# Patient Record
Sex: Female | Born: 1985
Health system: Southern US, Community
[De-identification: ages and names within clinical notes are randomized; demographics above are authoritative.]

## PROBLEM LIST (undated history)

## (undated) ENCOUNTER — Emergency Department (HOSPITAL_COMMUNITY): Discharge status: Absent without leave | Payer: Self-pay

## (undated) DIAGNOSIS — J40 Bronchitis, not specified as acute or chronic: Secondary | ICD-10-CM

## (undated) DIAGNOSIS — Z789 Other specified health status: Secondary | ICD-10-CM

## (undated) DIAGNOSIS — J189 Pneumonia, unspecified organism: Secondary | ICD-10-CM

## (undated) HISTORY — PX: WISDOM TOOTH EXTRACTION: SHX21

## (undated) HISTORY — DX: Other specified health status: Z78.9

---
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
## 2007-01-18 ENCOUNTER — Emergency Department (HOSPITAL_COMMUNITY): Admission: EM | Admit: 2007-01-18 | Discharge: 2007-01-18 | Payer: Self-pay | Admitting: Emergency Medicine

## 2007-02-02 ENCOUNTER — Emergency Department (HOSPITAL_COMMUNITY): Admission: EM | Admit: 2007-02-02 | Discharge: 2007-02-02 | Payer: Self-pay | Admitting: Emergency Medicine

## 2007-07-18 ENCOUNTER — Emergency Department (HOSPITAL_COMMUNITY): Admission: EM | Admit: 2007-07-18 | Discharge: 2007-07-18 | Payer: Self-pay | Admitting: Emergency Medicine

## 2007-08-28 ENCOUNTER — Emergency Department (HOSPITAL_COMMUNITY): Admission: EM | Admit: 2007-08-28 | Discharge: 2007-08-28 | Payer: Self-pay | Admitting: Emergency Medicine

## 2008-02-21 IMAGING — CR DG KNEE COMPLETE 4+V*L*
4 series · 4 of 4 positions shown · non-contrast
Comparison: none

CLINICAL DATA: Knee pain

LEFT KNEE - 4  VIEW:

[view not recorded (1 of 4)]
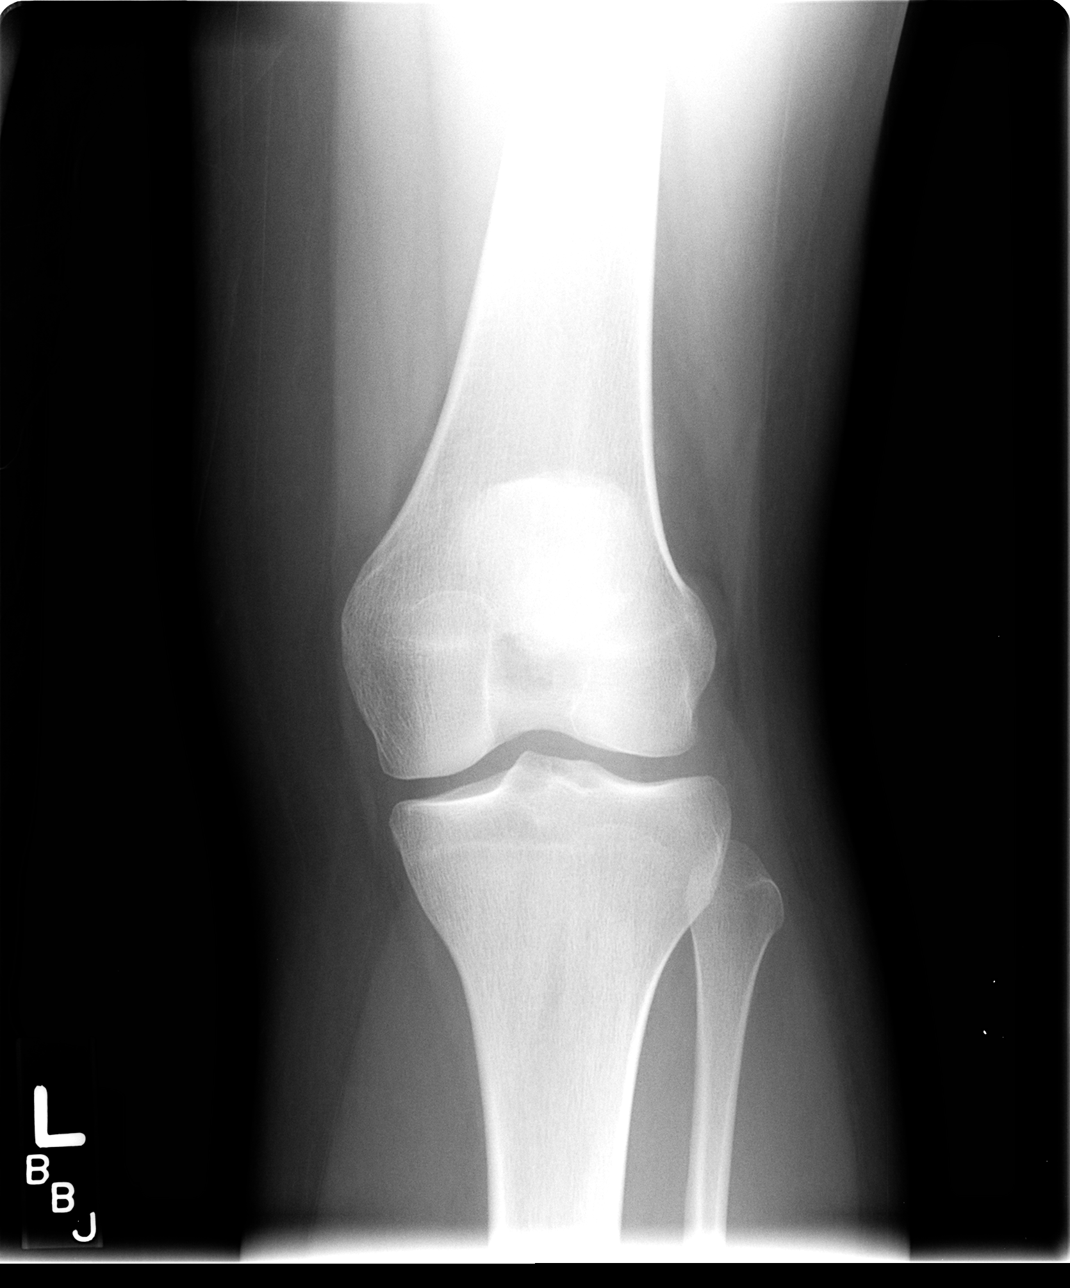

[view not recorded (2 of 4)]
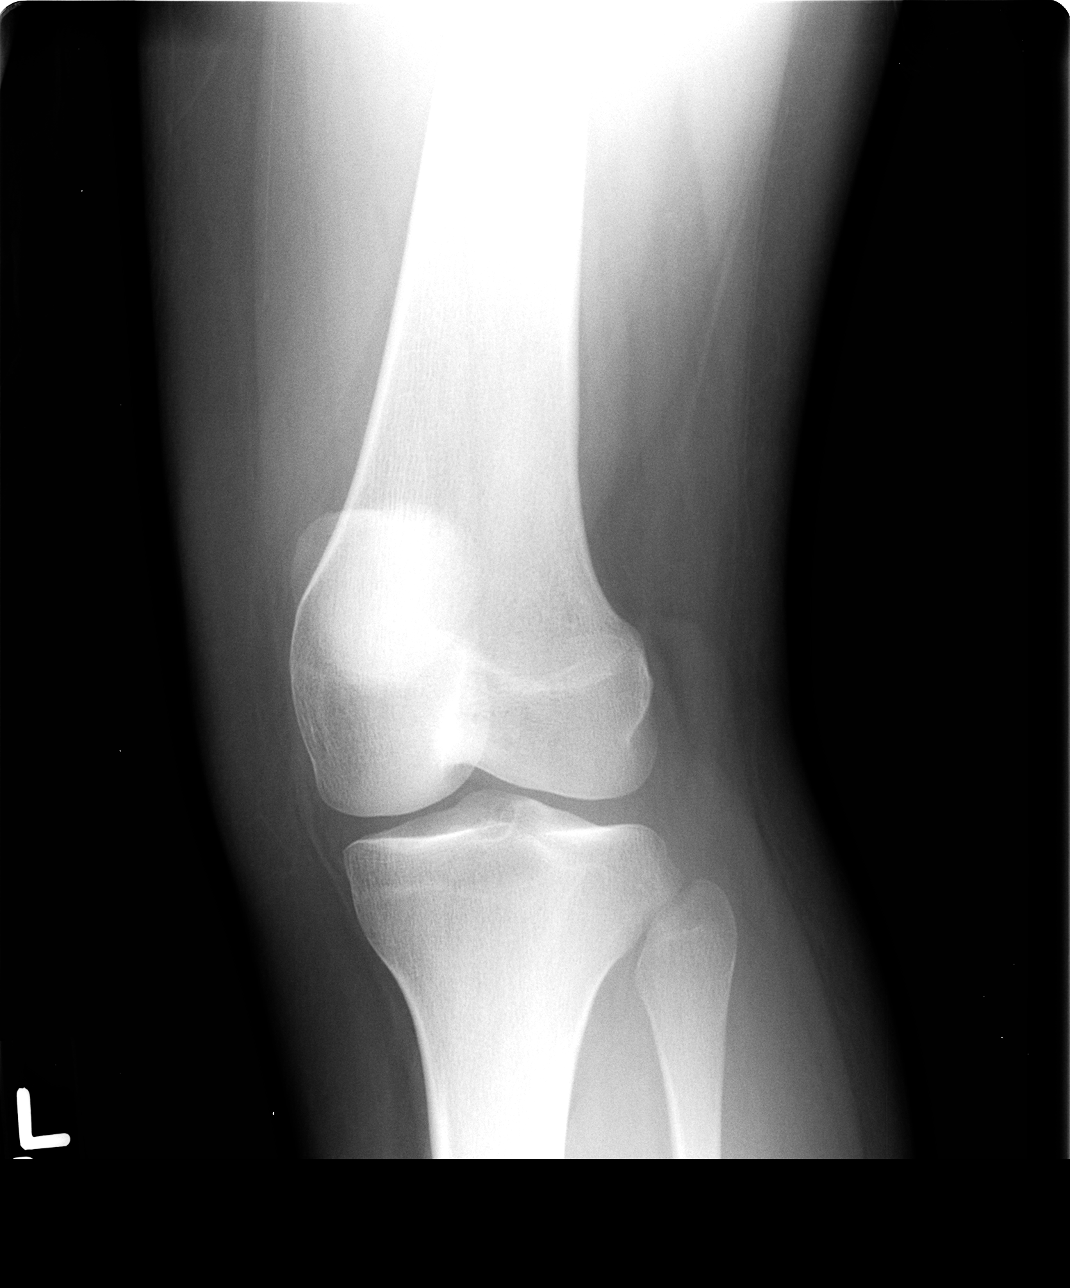

[view not recorded (3 of 4)]
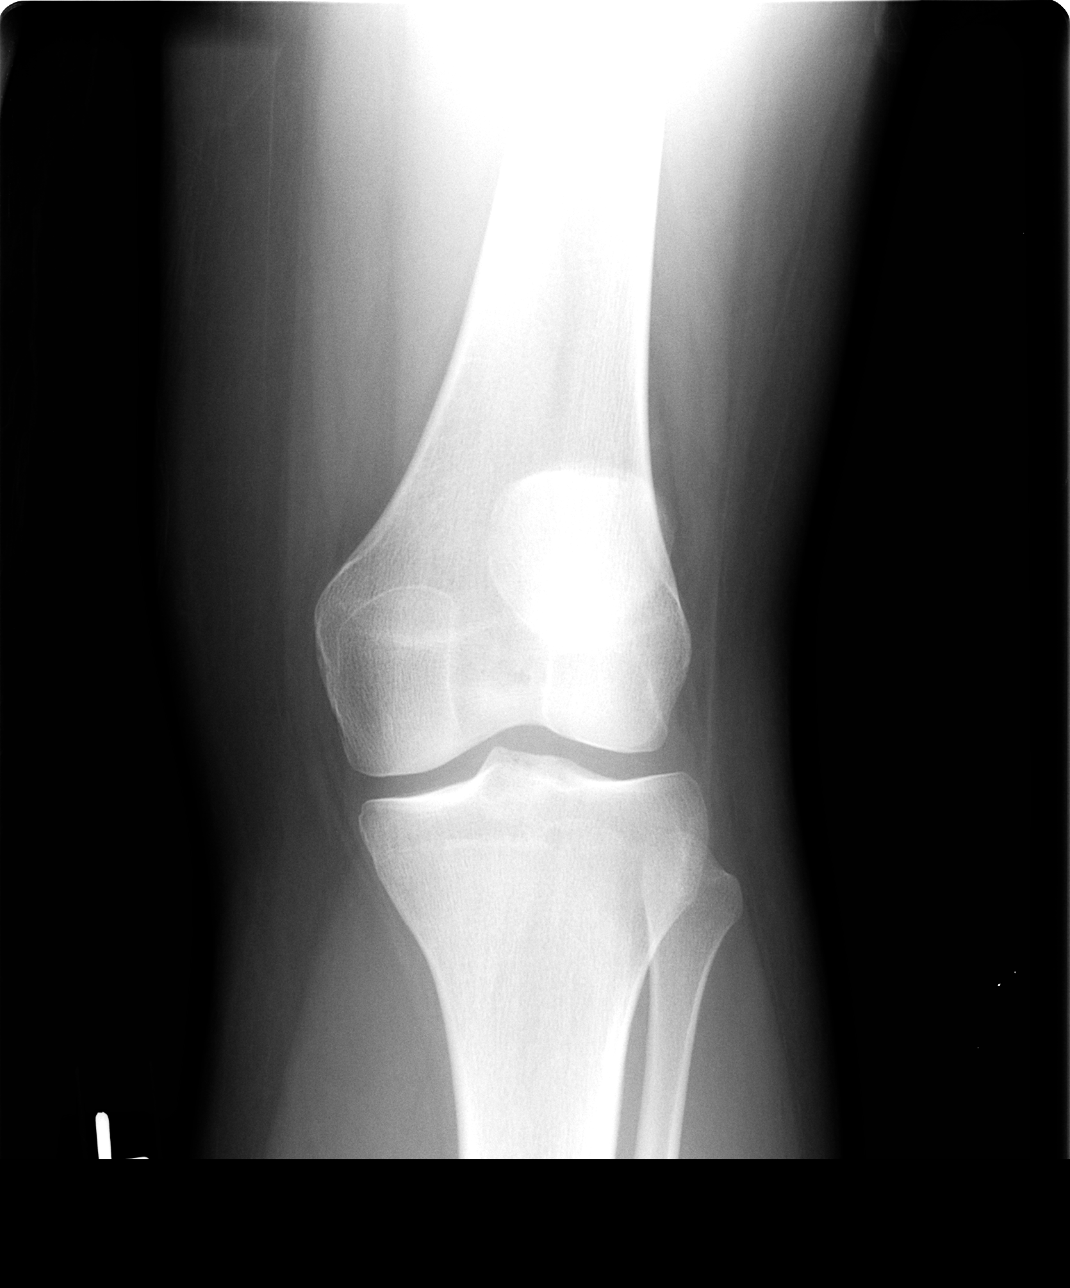

[view not recorded (4 of 4)]
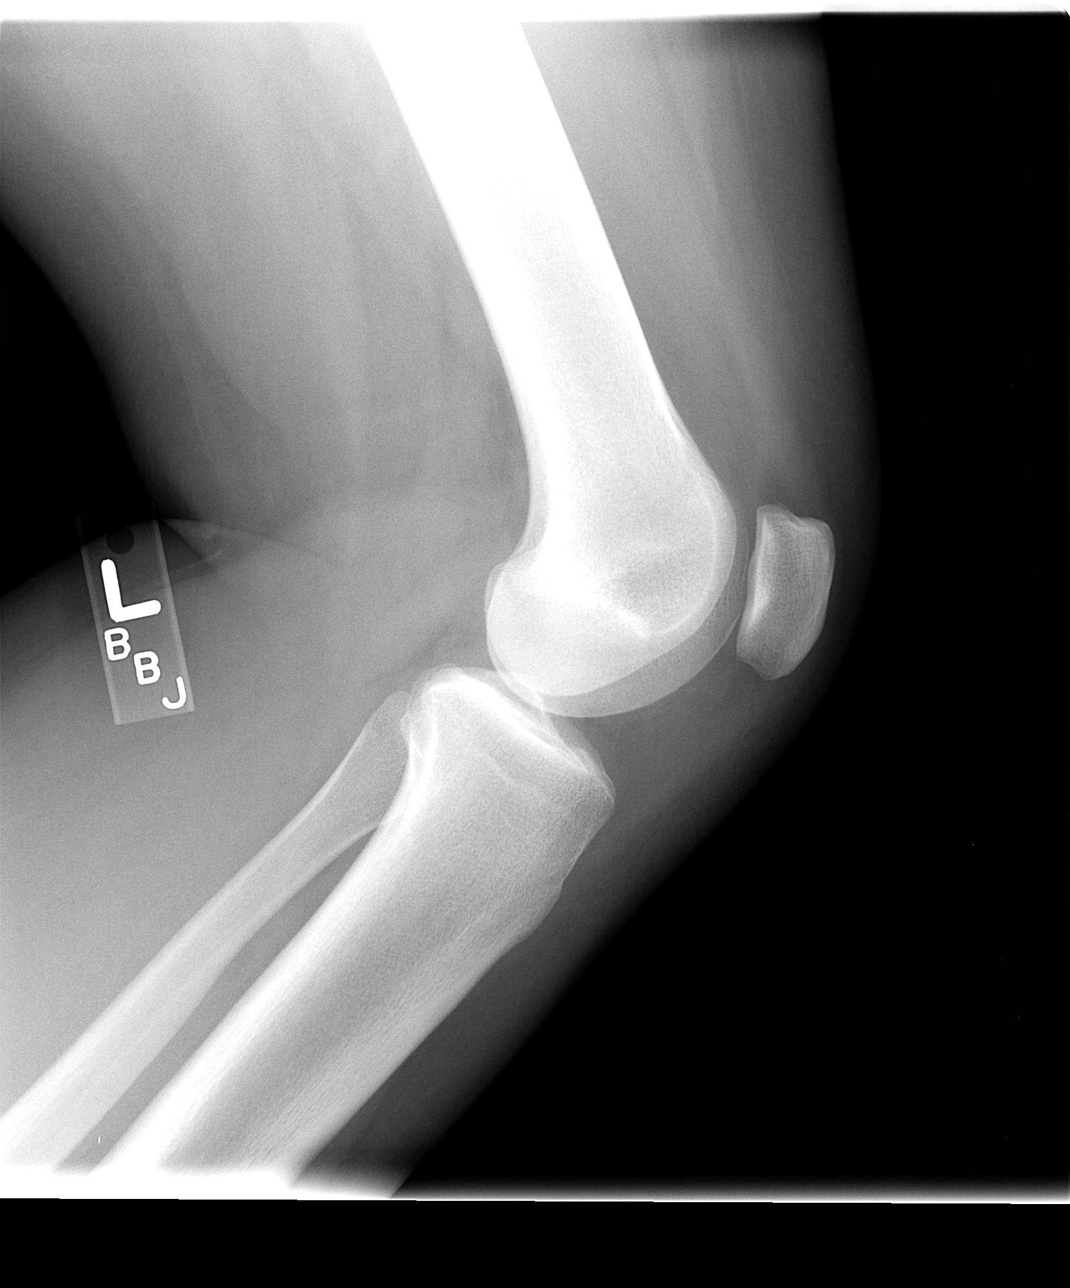

[4 of 4 positions shown; findings below may reference images not displayed]

FINDINGS: There is no evidence of fracture, dislocation, or joint effusion. 
There is no evidence of arthropathy or other focal bone abnormality.  Soft
tissues are unremarkable.
IMPRESSION: Negative.

## 2008-03-28 ENCOUNTER — Emergency Department (HOSPITAL_COMMUNITY): Admission: EM | Admit: 2008-03-28 | Discharge: 2008-03-28 | Payer: Self-pay | Admitting: Emergency Medicine

## 2008-04-14 ENCOUNTER — Emergency Department (HOSPITAL_COMMUNITY): Admission: EM | Admit: 2008-04-14 | Discharge: 2008-04-14 | Payer: Self-pay | Admitting: Emergency Medicine

## 2008-12-31 ENCOUNTER — Emergency Department (HOSPITAL_COMMUNITY): Admission: EM | Admit: 2008-12-31 | Discharge: 2008-12-31 | Payer: Self-pay | Admitting: Emergency Medicine

## 2011-03-16 LAB — HEPATIC FUNCTION PANEL
AST: 16 U/L (ref 0–37)
Albumin: 4.1 g/dL (ref 3.5–5.2)
Total Protein: 7.3 g/dL (ref 6.0–8.3)

## 2011-03-16 LAB — DIFFERENTIAL
Basophils Absolute: 0.1 10*3/uL (ref 0.0–0.1)
Eosinophils Relative: 3 % (ref 0–5)
Neutro Abs: 6.4 10*3/uL (ref 1.7–7.7)
Neutrophils Relative %: 67 % (ref 43–77)

## 2011-03-16 LAB — PREGNANCY, URINE: Preg Test, Ur: NEGATIVE

## 2011-03-16 LAB — URINE MICROSCOPIC-ADD ON

## 2011-03-16 LAB — CBC
HCT: 43.6 % (ref 36.0–46.0)
MCV: 95.3 fL (ref 78.0–100.0)
RBC: 4.57 MIL/uL (ref 3.87–5.11)
WBC: 9.7 10*3/uL (ref 4.0–10.5)

## 2011-03-16 LAB — BASIC METABOLIC PANEL
CO2: 32 mEq/L (ref 19–32)
Chloride: 99 mEq/L (ref 96–112)
Potassium: 3.4 mEq/L — ABNORMAL LOW (ref 3.5–5.1)
Sodium: 140 mEq/L (ref 135–145)

## 2011-03-16 LAB — URINALYSIS, ROUTINE W REFLEX MICROSCOPIC
Glucose, UA: NEGATIVE mg/dL
Ketones, ur: NEGATIVE mg/dL
Urobilinogen, UA: 1 mg/dL (ref 0.0–1.0)
pH: 5.5 (ref 5.0–8.0)

## 2011-11-10 ENCOUNTER — Emergency Department (HOSPITAL_COMMUNITY)
Admission: EM | Admit: 2011-11-10 | Discharge: 2011-11-10 | Disposition: A | Discharge status: Home / Self Care | Payer: Self-pay | Attending: Emergency Medicine | Admitting: Emergency Medicine

## 2011-11-10 ENCOUNTER — Encounter: Payer: Self-pay | Admitting: *Deleted

## 2011-11-10 DIAGNOSIS — L02219 Cutaneous abscess of trunk, unspecified: Secondary | ICD-10-CM | POA: Insufficient documentation

## 2011-11-10 DIAGNOSIS — F17200 Nicotine dependence, unspecified, uncomplicated: Secondary | ICD-10-CM | POA: Insufficient documentation

## 2011-11-10 DIAGNOSIS — L0390 Cellulitis, unspecified: Secondary | ICD-10-CM

## 2011-11-10 MED ORDER — DOXYCYCLINE HYCLATE 100 MG PO TABS
100.0000 mg | ORAL_TABLET | Freq: Once | ORAL | Status: AC
  Administered 2011-11-10: 100 mg via ORAL
  Filled 2011-11-10: qty 1

## 2011-11-10 MED ORDER — TRAMADOL-ACETAMINOPHEN 37.5-325 MG PO TABS: ORAL_TABLET | ORAL | Status: AC

## 2011-11-10 MED ORDER — TRAMADOL HCL 50 MG PO TABS
100.0000 mg | ORAL_TABLET | Freq: Once | ORAL | Status: AC
  Administered 2011-11-10: 100 mg via ORAL
  Filled 2011-11-10: qty 1

## 2011-11-10 MED ORDER — DOXYCYCLINE HYCLATE 100 MG PO CAPS: 100.0000 mg | ORAL_CAPSULE | Freq: Two times a day (BID) | ORAL | Status: AC

## 2011-11-10 MED ORDER — ACETAMINOPHEN 500 MG PO TABS
1000.0000 mg | ORAL_TABLET | Freq: Once | ORAL | Status: AC
  Administered 2011-11-10: 1000 mg via ORAL
  Filled 2011-11-10: qty 2

## 2011-11-10 NOTE — ED Provider Notes (Signed)
History     CSN: [id number] Arrival date & time: 11/10/2011  5:00 PM   First MD Initiated Contact with Patient 11/10/11 1719      Chief Complaint  Patient presents with  . infected belly button     (Consider location/radiation/quality/duration/timing/severity/associated sxs/prior treatment) HPI  Patient relates she was pulling 400 pound water pallets at work and started getting a sharp pain in her umbilicus area. She states afterward she had pain when she squatted reached up or leaned over. She relates she started noticing a tender area above her umbilicus. She states this afternoon she saw some blood in her umbilicus. He denies fever nausea or vomiting. She relates she was seen by her physician and was told she had a pulled muscle. She's never had anything like this before. Patient states she has an appointment to see her doctor again tomorrow.  Primary care Dr Lysbeth Galas  History reviewed. No pertinent past medical history.  History reviewed. No pertinent past surgical history.  History reviewed. No pertinent family history.  History  Substance Use Topics  . Smoking status: Current Everyday Smoker -- 1.0 packs/day  . Smokeless tobacco: Not on file  . Alcohol Use: No   employed  OB History    Grav Para Term Preterm Abortions TAB SAB Ect Mult Living                  Review of Systems  All other systems reviewed and are negative.    Allergies  Review of patient's allergies indicates no known allergies.  Home Medications   Current Outpatient Rx  Name Route Sig Dispense Refill  . IBUPROFEN 800 MG PO TABS Oral Take 800 mg by mouth every 8 (eight) hours as needed. For pain       BP 132/76  Pulse 68  Temp(Src) 98.2 F (36.8 C) (Oral)  Resp 20  Ht 5\' 8"  (1.727 m)  Wt 212 lb (96.163 kg)  BMI 32.23 kg/m2  SpO2 100%  LMP 11/10/2011  Physical Exam  Nursing note and vitals reviewed. Constitutional: She is oriented to person, place, and time. She appears  well-developed and well-nourished.  Non-toxic appearance. She does not appear ill. No distress.  HENT:  Head: Normocephalic and atraumatic.  Right Ear: External ear normal.  Left Ear: External ear normal.  Nose: Nose normal. No mucosal edema or rhinorrhea.  Mouth/Throat: Mucous membranes are normal. No dental abscesses or uvula swelling.  Eyes: Conjunctivae and EOM are normal. Pupils are equal, round, and reactive to light.  Neck: Normal range of motion and full passive range of motion without pain. Neck supple.  Pulmonary/Chest: Effort normal. No respiratory distress. She has no rhonchi. She exhibits no crepitus.  Abdominal: Soft. Normal appearance and bowel sounds are normal. She exhibits no distension. There is tenderness. There is no rebound and no guarding.       Patient's noted to have redness extending from the superior aspect of frontal like [place] that is red and slightly warm to touch. The area measures 4 x 6 cm in size. Her umbilicus was inspected there was no abscess seen. There is no abscess palpated. Her exam is consistent with cellulitis  Musculoskeletal: Normal range of motion. She exhibits no edema and no tenderness.       Moves all extremities well.   Neurological: She is alert and oriented to person, place, and time. She has normal strength. No cranial nerve deficit.  Skin: Skin is warm, dry and intact. No rash noted. No  erythema. No pallor.  Psychiatric: She has a normal mood and affect. Her speech is normal and behavior is normal. Her mood appears not anxious.   the red area was circled in ink ED Course  Procedures (including critical care time)  Labs Reviewed - No data to display No results found.   1. Cellulitis    New Prescriptions   DOXYCYCLINE (VIBRAMYCIN) 100 MG CAPSULE    Take 1 capsule (100 mg total) by mouth 2 (two) times daily.   TRAMADOL-ACETAMINOPHEN (ULTRACET) 37.5-325 MG PER TABLET    2 tabs po QID prn pain   Plan discharge   MDM          Ward Givens, MD 11/10/11 1827

## 2011-11-10 NOTE — ED Notes (Signed)
C/o pain with palpation or movement just above umbilicus-onset after heavy lifting at work; reports saw PCP this AM and was dx with "pulled muscle". States after visit with PCP, began having oozing of blood and pus from umbilicus.  Reports that she uses Q-tips to clean that area, but states, "I never go that deep".

## 2011-11-10 NOTE — ED Notes (Signed)
A&ox4; in no distress; left in c/o spouse for transport home. Instructions given and reviewed-verbalizes understanding.

## 2011-11-10 NOTE — ED Notes (Signed)
Pt c/o pain to her belly button. Pt has puss in her belly button and red skin in and around belly button.

## 2012-01-03 ENCOUNTER — Encounter (HOSPITAL_COMMUNITY): Payer: Self-pay | Admitting: *Deleted

## 2012-01-03 ENCOUNTER — Emergency Department (HOSPITAL_COMMUNITY)
Admission: EM | Admit: 2012-01-03 | Discharge: 2012-01-04 | Disposition: A | Discharge status: Home / Self Care | Payer: BC Managed Care – PPO | Attending: Emergency Medicine | Admitting: Emergency Medicine

## 2012-01-03 ENCOUNTER — Emergency Department (HOSPITAL_COMMUNITY): Payer: BC Managed Care – PPO

## 2012-01-03 DIAGNOSIS — R110 Nausea: Secondary | ICD-10-CM | POA: Insufficient documentation

## 2012-01-03 DIAGNOSIS — J111 Influenza due to unidentified influenza virus with other respiratory manifestations: Secondary | ICD-10-CM | POA: Insufficient documentation

## 2012-01-03 DIAGNOSIS — D72829 Elevated white blood cell count, unspecified: Secondary | ICD-10-CM | POA: Insufficient documentation

## 2012-01-03 DIAGNOSIS — E860 Dehydration: Secondary | ICD-10-CM | POA: Insufficient documentation

## 2012-01-03 DIAGNOSIS — R1110 Vomiting, unspecified: Secondary | ICD-10-CM

## 2012-01-03 DIAGNOSIS — J189 Pneumonia, unspecified organism: Secondary | ICD-10-CM | POA: Insufficient documentation

## 2012-01-03 DIAGNOSIS — F17200 Nicotine dependence, unspecified, uncomplicated: Secondary | ICD-10-CM | POA: Insufficient documentation

## 2012-01-03 LAB — URINALYSIS, ROUTINE W REFLEX MICROSCOPIC
Bilirubin Urine: NEGATIVE
Glucose, UA: NEGATIVE mg/dL
Hgb urine dipstick: NEGATIVE
Ketones, ur: NEGATIVE mg/dL
Protein, ur: NEGATIVE mg/dL
Urobilinogen, UA: 0.2 mg/dL (ref 0.0–1.0)
pH: 6 (ref 5.0–8.0)

## 2012-01-03 LAB — BASIC METABOLIC PANEL
CO2: 27 mEq/L (ref 19–32)
Creatinine, Ser: 0.76 mg/dL (ref 0.50–1.10)
GFR calc Af Amer: >90 mL/min (ref >90)
Glucose, Bld: 125 mg/dL — ABNORMAL HIGH (ref 70–99)

## 2012-01-03 LAB — CBC
HCT: 42.7 % (ref 36.0–46.0)
MCH: 33.4 pg (ref 26.0–34.0)
MCV: 94.5 fL (ref 78.0–100.0)
Platelets: 290 10*3/uL (ref 150–400)
RDW: 12.2 % (ref 11.5–15.5)
WBC: 11.6 10*3/uL — ABNORMAL HIGH (ref 4.0–10.5)

## 2012-01-03 LAB — DIFFERENTIAL
Basophils Absolute: 0 10*3/uL (ref 0.0–0.1)
Basophils Relative: 0 % (ref 0–1)
Eosinophils Relative: 1 % (ref 0–5)
Lymphs Abs: 1.2 10*3/uL (ref 0.7–4.0)

## 2012-01-03 MED ORDER — METOCLOPRAMIDE HCL 5 MG/ML IJ SOLN
10.0000 mg | Freq: Once | INTRAMUSCULAR | Status: AC
  Administered 2012-01-03: 10 mg via INTRAVENOUS
  Filled 2012-01-03: qty 2

## 2012-01-03 MED ORDER — SODIUM CHLORIDE 0.9 % IV BOLUS (SEPSIS)
1000.0000 mL | Freq: Once | INTRAVENOUS | Status: AC
  Administered 2012-01-03: 1000 mL via INTRAVENOUS

## 2012-01-03 MED ORDER — DIPHENHYDRAMINE HCL 50 MG/ML IJ SOLN
25.0000 mg | Freq: Once | INTRAMUSCULAR | Status: AC
  Administered 2012-01-03: 25 mg via INTRAVENOUS
  Filled 2012-01-03: qty 1

## 2012-01-03 MED ORDER — SODIUM CHLORIDE 0.9 % IV SOLN
INTRAVENOUS | Status: DC
  Administered 2012-01-03: via INTRAVENOUS

## 2012-01-03 NOTE — ED Notes (Signed)
Multiple complaints, fever, vomiting, headache, coughing and body aches

## 2012-01-03 NOTE — ED Provider Notes (Signed)
History   This chart was scribed for Ward Givens, MD by Sofie Rower. The patient was seen in room APA01/APA01 and the patient's care was started at 11:14PM.    CSN: [id number]  Arrival date & time 01/03/12  2216   First MD Initiated Contact with Patient 01/03/12 2306      Chief Complaint  Patient presents with  . Fever    (Consider location/radiation/quality/duration/timing/severity/associated sxs/prior treatment) HPI  Patient states yesterday she started having fever to 102.1. She states her fevers been off and on. She states she's vomiting every 30 minutes. She describes myalgias with body aches all over and also her legs. She has had a dry cough but denies sore throat, rhinorrhea, or diarrhea. She states she has some epigastric discomfort. She states her boyfriend's aunt and has had similar symptoms.  PCP is Dr. Denzil Magnuson [facility]   History reviewed. No pertinent past medical history.  History reviewed. No pertinent past surgical history.  No family history on file.  History  Substance Use Topics  . Smoking status: Current Everyday Smoker -- 1.0 packs/day  . Smokeless tobacco: Not on file  . Alcohol Use: No   employed  OB History    Grav Para Term Preterm Abortions TAB SAB Ect Mult Living                  Review of Systems  All other systems reviewed and are negative.    Allergies  Review of patient's allergies indicates no known allergies.  Home Medications   Current Outpatient Rx  Name Route Sig Dispense Refill  . DAYQUIL PO Oral Take 30 mLs by mouth once as needed. For cold symptoms      BP 112/59  Pulse 104  Temp(Src) 98.6 F (37 C) (Oral)  Resp 20  Ht 5\' 8"  (1.727 m)  Wt 195 lb (88.451 kg)  BMI 29.65 kg/m2  SpO2 96%  LMP 12/31/2011  Vital signs normal for tachycardia   Physical Exam  Nursing note and vitals reviewed. Constitutional: She is oriented to person, place, and time. She appears well-developed and well-nourished.  HENT:   Head: Normocephalic and atraumatic.  Right Ear: External ear normal.  Left Ear: External ear normal.  Nose: Nose normal.       Tongue dry.  Eyes: Conjunctivae and EOM are normal. Pupils are equal, round, and reactive to light. No scleral icterus.  Neck: Normal range of motion. Neck supple. No thyromegaly present.  Cardiovascular: Normal rate, regular rhythm and normal heart sounds.  Exam reveals no gallop and no friction rub.   No murmur heard. Pulmonary/Chest: Effort normal and breath sounds normal. No stridor. She has no wheezes. She has no rales. She exhibits no tenderness.  Abdominal: Soft. Bowel sounds are normal. She exhibits no distension. There is no tenderness. There is no rebound.  Musculoskeletal: Normal range of motion. She exhibits no edema.  Lymphadenopathy:    She has no cervical adenopathy.  Neurological: She is alert and oriented to person, place, and time. Coordination normal.  Skin: Skin is warm and dry. No rash noted. No erythema.  Psychiatric: She has a normal mood and affect. Her behavior is normal.    ED Course  Procedures (including critical care time)  DIAGNOSTIC STUDIES: Oxygen Saturation is 96% on room air, normal by my interpretation.    COORDINATION OF CARE: Patient given IV fluids with IV Reglan and Benadryl for her vomiting. After reviewing her x-ray she was given Rocephin 1 g  IV.   Results for orders placed during the hospital encounter of 01/03/12  URINALYSIS, ROUTINE W REFLEX MICROSCOPIC      Component Value Range   Color, Urine YELLOW  YELLOW    APPearance CLEAR  CLEAR    Specific Gravity, Urine >1.030 (*) 1.005 - 1.030    pH 6.0  5.0 - 8.0    Glucose, UA NEGATIVE  NEGATIVE (mg/dL)   Hgb urine dipstick NEGATIVE  NEGATIVE    Bilirubin Urine NEGATIVE  NEGATIVE    Ketones, ur NEGATIVE  NEGATIVE (mg/dL)   Protein, ur NEGATIVE  NEGATIVE (mg/dL)   Urobilinogen, UA 0.2  0.0 - 1.0 (mg/dL)   Nitrite NEGATIVE  NEGATIVE    Leukocytes, UA NEGATIVE   NEGATIVE   PREGNANCY, URINE      Component Value Range   Preg Test, Ur NEGATIVE  NEGATIVE   CBC      Component Value Range   WBC 11.6 (*) 4.0 - 10.5 (K/uL)   RBC 4.52  3.87 - 5.11 (MIL/uL)   Hemoglobin 15.1 (*) 12.0 - 15.0 (g/dL)   HCT 16.1  09.6 - 04.5 (%)   MCV 94.5  78.0 - 100.0 (fL)   MCH 33.4  26.0 - 34.0 (pg)   MCHC 35.4  30.0 - 36.0 (g/dL)   RDW 40.9  81.1 - 91.4 (%)   Platelets 290  150 - 400 (K/uL)  DIFFERENTIAL      Component Value Range   Neutrophils Relative 77  43 - 77 (%)   Neutro Abs 9.0 (*) 1.7 - 7.7 (K/uL)   Lymphocytes Relative 11 (*) 12 - 46 (%)   Lymphs Abs 1.2  0.7 - 4.0 (K/uL)   Monocytes Relative 11  3 - 12 (%)   Monocytes Absolute 1.3 (*) 0.1 - 1.0 (K/uL)   Eosinophils Relative 1  0 - 5 (%)   Eosinophils Absolute 0.1  0.0 - 0.7 (K/uL)   Basophils Relative 0  0 - 1 (%)   Basophils Absolute 0.0  0.0 - 0.1 (K/uL)  BASIC METABOLIC PANEL      Component Value Range   Sodium 137  135 - 145 (mEq/L)   Potassium 3.9  3.5 - 5.1 (mEq/L)   Chloride 100  96 - 112 (mEq/L)   CO2 27  19 - 32 (mEq/L)   Glucose, Bld 125 (*) 70 - 99 (mg/dL)   BUN 8  6 - 23 (mg/dL)   Creatinine, Ser 7.82  0.50 - 1.10 (mg/dL)   Calcium 9.6  8.4 - 95.6 (mg/dL)   GFR calc non Af Amer >90  >90 (mL/min)   GFR calc Af Amer >90  >90 (mL/min)   Laboratory interpretation all normal except for leukocytosis  Dg Chest 2 View  01/03/2012  *RADIOLOGY REPORT*  Clinical Data: Cough, fever.  CHEST - 2 VIEW  Comparison: 12/31/2008  Findings: Central bronchitic changes.  Linear left lingular and lower lobe opacities.  No pneumothorax.  No pleural effusion.  The heart size is within normal limits.  No acute osseous abnormality.  IMPRESSION: Bronchitic changes.  Linear opacities within the left lung may reflect atelectasis or infiltrate.  Original Report Authenticated By: Waneta Martins, M.D.   Diagnoses that have been ruled out:  None  Diagnoses that are still under consideration:  None  Final  diagnoses:  Pneumonia, community acquired  Influenza  Vomiting  Dehydration    New Prescriptions   AZITHROMYCIN (ZITHROMAX) 250 MG TABLET    Take 2 po the first  day then once a day for the next 4 days.   PROMETHAZINE (PHENERGAN) 25 MG SUPPOSITORY    Place 1 suppository (25 mg total) rectally every 6 (six) hours as needed for nausea.    Plan discharge Devoria Albe, MD, FACEP   MDM     I personally performed the services described in this documentation, which was scribed in my presence. The recorded information has been reviewed and considered. Devoria Albe, MD, Armando Gang       Ward Givens, MD 01/04/12 [phone]

## 2012-01-04 MED ORDER — AZITHROMYCIN 250 MG PO TABS: ORAL_TABLET | ORAL | Status: AC

## 2012-01-04 MED ORDER — DEXTROSE 5 % IV SOLN
INTRAVENOUS | Status: AC
  Administered 2012-01-04: 1 g
  Filled 2012-01-04: qty 10

## 2012-01-04 MED ORDER — PROMETHAZINE HCL 25 MG RE SUPP: 25.0000 mg | Freq: Four times a day (QID) | RECTAL | Status: AC | PRN

## 2012-01-04 MED ORDER — DEXTROSE 5 % IV SOLN
1.0000 g | Freq: Once | INTRAVENOUS | Status: DC
  Filled 2012-01-04: qty 10

## 2012-11-10 ENCOUNTER — Emergency Department (HOSPITAL_COMMUNITY): Payer: BC Managed Care – PPO

## 2012-11-10 ENCOUNTER — Encounter (HOSPITAL_COMMUNITY): Payer: Self-pay | Admitting: *Deleted

## 2012-11-10 ENCOUNTER — Emergency Department (HOSPITAL_COMMUNITY)
Admission: EM | Admit: 2012-11-10 | Discharge: 2012-11-10 | Disposition: A | Discharge status: Home / Self Care | Payer: BC Managed Care – PPO | Attending: Emergency Medicine | Admitting: Emergency Medicine

## 2012-11-10 ENCOUNTER — Other Ambulatory Visit: Payer: Self-pay

## 2012-11-10 DIAGNOSIS — F17200 Nicotine dependence, unspecified, uncomplicated: Secondary | ICD-10-CM | POA: Insufficient documentation

## 2012-11-10 DIAGNOSIS — R0789 Other chest pain: Secondary | ICD-10-CM

## 2012-11-10 MED ORDER — KETOROLAC TROMETHAMINE 60 MG/2ML IM SOLN
60.0000 mg | Freq: Once | INTRAMUSCULAR | Status: AC
  Administered 2012-11-10: 60 mg via INTRAMUSCULAR
  Filled 2012-11-10: qty 2

## 2012-11-10 NOTE — ED Provider Notes (Signed)
History     CSN: [id number]  Arrival date & time 11/10/12  2223   First MD Initiated Contact with Patient 11/10/12 2250      Chief Complaint  Patient presents with  . Chest Pain    (Consider location/radiation/quality/duration/timing/severity/associated sxs/prior treatment) HPI Comments: Patient started yesterday with pains to the front of her chest.  She denies any shortness of breath, cough, or fever.  There is no injury or trauma.  She denies any recent exertional symptoms.  She has no prior cardiac history and this has never happened before.  She does smoke.  Patient is a 26 y.o. female presenting with chest pain. The history is provided by the patient.  Chest Pain The chest pain began yesterday. Chest pain occurs constantly. The chest pain is worsening. The pain is associated with breathing. The severity of the pain is moderate. The quality of the pain is described as tightness. The pain does not radiate. Chest pain is worsened by certain positions and deep breathing. Pertinent negatives for primary symptoms include no fever, no cough, no wheezing, no nausea and no vomiting. She tried nothing for the symptoms. There are no known risk factors.     History reviewed. No pertinent past medical history.  History reviewed. No pertinent past surgical history.  History reviewed. No pertinent family history.  History  Substance Use Topics  . Smoking status: Current Every Day Smoker -- 1.0 packs/day  . Smokeless tobacco: Not on file  . Alcohol Use: No    OB History    Grav Para Term Preterm Abortions TAB SAB Ect Mult Living                  Review of Systems  Constitutional: Negative for fever.  Respiratory: Negative for cough and wheezing.   Cardiovascular: Positive for chest pain.  Gastrointestinal: Negative for nausea and vomiting.  All other systems reviewed and are negative.    Allergies  Review of patient's allergies indicates no known allergies.  Home  Medications  No current outpatient prescriptions on file.  BP 117/68  Pulse 69  Temp 97.7 F (36.5 C) (Oral)  Resp 20  Ht 5\' 8"  (1.727 m)  Wt 207 lb (93.895 kg)  BMI 31.47 kg/m2  SpO2 100%  LMP 11/06/2012  Physical Exam  Nursing note and vitals reviewed. Constitutional: She is oriented to person, place, and time. She appears well-developed and well-nourished. No distress.  HENT:  Head: Normocephalic and atraumatic.  Neck: Normal range of motion. Neck supple.  Cardiovascular: Normal rate and regular rhythm.  Exam reveals no gallop and no friction rub.   No murmur heard. Pulmonary/Chest: Effort normal and breath sounds normal. No respiratory distress. She has no wheezes.  Abdominal: Soft. Bowel sounds are normal. She exhibits no distension. There is no tenderness.  Musculoskeletal: Normal range of motion.  Neurological: She is alert and oriented to person, place, and time.  Skin: Skin is warm and dry. She is not diaphoretic.    ED Course  Procedures (including critical care time)  Labs Reviewed - No data to display No results found.   No diagnosis found.   Date: 11/10/2012  Rate: 63  Rhythm: normal sinus rhythm  QRS Axis: normal  Intervals: normal  ST/T Wave abnormalities: normal  Conduction Disutrbances:none  Narrative Interpretation:   Old EKG Reviewed: none available    MDM  The patient presents with discomfort in the front of her chest that is atypical for cardiac pain.  She has no  risk factors and the ekg and chest xray are unremarkable.  She is feeling somewhat better with toradol.  She will be discharged with nsaids, rest, follow up prn.          Geoffery Lyons, MD 11/10/12 [phone]

## 2012-11-10 NOTE — ED Notes (Signed)
"  my lungs hurt",  No cough or cold sx but hurts to take a deep breath and movement causes pain.

## 2012-11-13 ENCOUNTER — Emergency Department (HOSPITAL_COMMUNITY)
Admission: EM | Admit: 2012-11-13 | Discharge: 2012-11-13 | Disposition: A | Discharge status: Home / Self Care | Payer: BC Managed Care – PPO | Attending: Emergency Medicine | Admitting: Emergency Medicine

## 2012-11-13 ENCOUNTER — Encounter (HOSPITAL_COMMUNITY): Payer: Self-pay | Admitting: *Deleted

## 2012-11-13 DIAGNOSIS — R05 Cough: Secondary | ICD-10-CM | POA: Insufficient documentation

## 2012-11-13 DIAGNOSIS — J209 Acute bronchitis, unspecified: Secondary | ICD-10-CM

## 2012-11-13 DIAGNOSIS — F17200 Nicotine dependence, unspecified, uncomplicated: Secondary | ICD-10-CM | POA: Insufficient documentation

## 2012-11-13 DIAGNOSIS — R059 Cough, unspecified: Secondary | ICD-10-CM | POA: Insufficient documentation

## 2012-11-13 MED ORDER — AZITHROMYCIN 250 MG PO TABS: ORAL_TABLET | ORAL | Status: DC

## 2012-11-13 MED ORDER — GUAIFENESIN-CODEINE 100-10 MG/5ML PO SYRP: ORAL_SOLUTION | ORAL | Status: DC

## 2012-11-13 NOTE — ED Notes (Signed)
Pt with mid chest pain since 11/10/12, today woke up with emesis and chills

## 2012-11-13 NOTE — ED Provider Notes (Deleted)
4:13 PM  Date: 11/13/2012  Rate: 86  Rhythm: normal sinus rhythm  QRS Axis: normal  Intervals: normal  ST/T Wave abnormalities: normal  Conduction Disutrbances:none  Narrative Interpretation: Normal EKG  Old EKG Reviewed: unchanged    Carleene Cooper III, MD 11/13/12 [phone]

## 2012-11-13 NOTE — ED Provider Notes (Signed)
History     CSN: [id number]  Arrival date & time 11/13/12  1545   First MD Initiated Contact with Patient 11/13/12 1806      Chief Complaint  Patient presents with  . Chest Pain    (Consider location/radiation/quality/duration/timing/severity/associated sxs/prior treatment) Patient is a 26 y.o. female presenting with chest pain. The history is provided by the patient. No language interpreter was used.  Chest Pain The chest pain began 3 - 5 days ago. Episode Length: Pt seen 3 days ago for chest pain, exam and EKG and chest x-ray negative.  She continues with chest pain and now has a nonproductive cough. Chest pain occurs intermittently. The chest pain is unchanged. The pain is associated with coughing. At its most intense, the pain is at 8/10. The pain is currently at 8/10. The quality of the pain is described as pleuritic. The pain does not radiate. Chest pain is worsened by deep breathing and smoking. Primary symptoms include cough. Pertinent negatives for primary symptoms include no fever.  The cough began 3 to 5 days ago. The cough is new. The cough is non-productive and dry. Associated symptoms comments: None . She tried NSAIDs for the symptoms. Risk factors include smoking/tobacco exposure. Past medical history comments: None     History reviewed. No pertinent past medical history.  History reviewed. No pertinent past surgical history.  History reviewed. No pertinent family history.  History  Substance Use Topics  . Smoking status: Current Every Day Smoker -- 1.0 packs/day  . Smokeless tobacco: Not on file  . Alcohol Use: No    OB History    Grav Para Term Preterm Abortions TAB SAB Ect Mult Living                  Review of Systems  Constitutional: Negative for fever and chills.  HENT: Negative.   Respiratory: Positive for cough.   Cardiovascular: Positive for chest pain.  Gastrointestinal: Negative.   Genitourinary: Negative.   Musculoskeletal: Negative.    Skin: Negative.   Neurological: Negative.   Psychiatric/Behavioral: Negative.     Allergies  Review of patient's allergies indicates no known allergies.  Home Medications  No current outpatient prescriptions on file.  BP 132/69  Pulse 88  Temp 98.5 F (36.9 C) (Oral)  Resp 20  Ht 5\' 8"  (1.727 m)  Wt 207 lb (93.895 kg)  BMI 31.47 kg/m2  SpO2 97%  LMP 11/06/2012  Physical Exam  Vitals reviewed. Constitutional: She is oriented to person, place, and time. She appears well-developed and well-nourished. No distress.  HENT:  Head: Normocephalic and atraumatic.  Right Ear: External ear normal.  Left Ear: External ear normal.  Mouth/Throat: Oropharynx is clear and moist.  Eyes: Conjunctivae normal and EOM are normal. Pupils are equal, round, and reactive to light.  Neck: Normal range of motion. Neck supple.  Cardiovascular: Normal rate, regular rhythm and normal heart sounds.   Pulmonary/Chest: Effort normal and breath sounds normal.  Abdominal: Soft.  Musculoskeletal: Normal range of motion.  Neurological: She is alert and oriented to person, place, and time.       No sensory or motor deficit.  Skin: Skin is warm and dry.  Psychiatric: She has a normal mood and affect. Her behavior is normal.    ED Course  Procedures (including critical care time   Date: 11/13/2012  Rate: 86  Rhythm: normal sinus rhythm  QRS Axis: normal  Intervals: normal  ST/T Wave abnormalities: normal  Conduction Disutrbances:none  Narrative  Interpretation: Normal  Old EKG Reviewed: unchanged  Rx with Z-pak, Robitussin AC.    1. Acute bronchitis        Carleene Cooper III, MD 11/13/12 [phone]

## 2013-05-08 ENCOUNTER — Emergency Department (HOSPITAL_COMMUNITY): Payer: BC Managed Care – PPO

## 2013-05-08 ENCOUNTER — Emergency Department (HOSPITAL_COMMUNITY)
Admission: EM | Admit: 2013-05-08 | Discharge: 2013-05-08 | Disposition: A | Discharge status: Home / Self Care | Payer: BC Managed Care – PPO | Attending: Emergency Medicine | Admitting: Emergency Medicine

## 2013-05-08 ENCOUNTER — Encounter (HOSPITAL_COMMUNITY): Payer: Self-pay | Admitting: Emergency Medicine

## 2013-05-08 DIAGNOSIS — IMO0001 Reserved for inherently not codable concepts without codable children: Secondary | ICD-10-CM | POA: Insufficient documentation

## 2013-05-08 DIAGNOSIS — R5381 Other malaise: Secondary | ICD-10-CM | POA: Insufficient documentation

## 2013-05-08 DIAGNOSIS — J3489 Other specified disorders of nose and nasal sinuses: Secondary | ICD-10-CM | POA: Insufficient documentation

## 2013-05-08 DIAGNOSIS — R509 Fever, unspecified: Secondary | ICD-10-CM | POA: Insufficient documentation

## 2013-05-08 DIAGNOSIS — F17200 Nicotine dependence, unspecified, uncomplicated: Secondary | ICD-10-CM | POA: Insufficient documentation

## 2013-05-08 DIAGNOSIS — J029 Acute pharyngitis, unspecified: Secondary | ICD-10-CM | POA: Insufficient documentation

## 2013-05-08 DIAGNOSIS — R498 Other voice and resonance disorders: Secondary | ICD-10-CM | POA: Insufficient documentation

## 2013-05-08 DIAGNOSIS — J069 Acute upper respiratory infection, unspecified: Secondary | ICD-10-CM

## 2013-05-08 MED ORDER — BENZONATATE 100 MG PO CAPS: 100.0000 mg | ORAL_CAPSULE | Freq: Three times a day (TID) | ORAL | Status: DC

## 2013-05-08 MED ORDER — CETIRIZINE-PSEUDOEPHEDRINE ER 5-120 MG PO TB12: 1.0000 | ORAL_TABLET | Freq: Two times a day (BID) | ORAL | Status: DC

## 2013-05-08 MED ORDER — ALBUTEROL SULFATE HFA 108 (90 BASE) MCG/ACT IN AERS
2.0000 | INHALATION_SPRAY | Freq: Once | RESPIRATORY_TRACT | Status: AC
  Administered 2013-05-08: 2 via RESPIRATORY_TRACT
  Filled 2013-05-08: qty 6.7

## 2013-05-08 MED ORDER — AZITHROMYCIN 250 MG PO TABS: 250.0000 mg | ORAL_TABLET | Freq: Every day | ORAL | Status: DC

## 2013-05-08 NOTE — ED Provider Notes (Signed)
  Medical screening examination/treatment/procedure(s) were performed by non-physician practitioner and as supervising physician I was immediately available for consultation/collaboration.    Gerhard Munch, MD 05/08/13 1630

## 2013-05-08 NOTE — ED Notes (Signed)
Pt with c/o congestion/cough that started 5 days ago.

## 2013-05-08 NOTE — ED Provider Notes (Signed)
History     CSN: [id number]  Arrival date & time 05/08/13  0827   First MD Initiated Contact with Patient 05/08/13 [phone]      Chief Complaint  Patient presents with  . URI    (Consider location/radiation/quality/duration/timing/severity/associated sxs/prior treatment) HPI Cindy Mcguire is a 27 y.o. female who presents to ED with complaint of cough and congestion. States symptoms began 5 days ago. Reports trying tylenol with no relief. States when symptoms began, had fever of 102. States now having mild sore throat as well. States that she is also now having post tussive emesis. Denies taking any other medications or decongestants. Denies n/v/d. No neck pain or stiffness. No other complaints.    History reviewed. No pertinent past medical history.  History reviewed. No pertinent past surgical history.  No family history on file.  History  Substance Use Topics  . Smoking status: Current Every Day Smoker -- 1.00 packs/day  . Smokeless tobacco: Not on file  . Alcohol Use: No    OB History   Grav Para Term Preterm Abortions TAB SAB Ect Mult Living                  Review of Systems  Constitutional: Positive for fever, chills and fatigue.  HENT: Positive for congestion, sore throat, voice change and sinus pressure. Negative for trouble swallowing, neck pain and neck stiffness.   Respiratory: Positive for cough. Negative for chest tightness, shortness of breath and wheezing.   Cardiovascular: Negative.   Gastrointestinal: Negative.   Genitourinary: Negative for dysuria.  Musculoskeletal: Positive for myalgias.  Skin: Negative.   Neurological: Negative for weakness and numbness.    Allergies  Review of patient's allergies indicates no known allergies.  Home Medications   Current Outpatient Rx  Name  Route  Sig  Dispense  Refill  . azithromycin (ZITHROMAX) 250 MG tablet      Take 2 tablets today, and then take 1 tablet once a day for the next 4 days.   6 tablet  0   . guaiFENesin-codeine (ROBITUSSIN AC) 100-10 MG/5ML syrup      Take 2 teaspoons every 4 hours if needed for cough.   120 mL   0     BP 124/68  Pulse 81  Temp(Src) 98.5 F (36.9 C) (Oral)  Resp 18  Ht 5\' 8"  (1.727 m)  Wt 205 lb (92.987 kg)  BMI 31.18 kg/m2  SpO2 100%  LMP 05/08/2013  Physical Exam  Nursing note and vitals reviewed. Constitutional: She is oriented to person, place, and time. She appears well-developed and well-nourished. No distress.  HENT:  Head: Normocephalic.  Right Ear: Tympanic membrane, external ear and ear canal normal.  Left Ear: Tympanic membrane, external ear and ear canal normal.  Nose: Mucosal edema and rhinorrhea present. Right sinus exhibits no maxillary sinus tenderness and no frontal sinus tenderness. Left sinus exhibits no maxillary sinus tenderness and no frontal sinus tenderness.  Mouth/Throat: Uvula is midline and oropharynx is clear and moist.  Eyes: Conjunctivae are normal.  Neck: Normal range of motion. Neck supple.  Cardiovascular: Normal rate, regular rhythm and normal heart sounds.   Pulmonary/Chest: Effort normal and breath sounds normal. No respiratory distress. She has no wheezes. She has no rales.  Musculoskeletal: She exhibits no edema.  Neurological: She is alert and oriented to person, place, and time.  Skin: Skin is warm and dry.    ED Course  Procedures (including critical care time)  Dg Chest 2 View  05/08/2013   *RADIOLOGY REPORT*  Clinical Data: Cough, congestion for 5 days, fever  CHEST - 2 VIEW  Comparison: Chest x-ray of 11/10/2012.  Findings: No active infiltrate or effusion is seen.  Mediastinal contours are stable.  The heart is within normal limits in size. No bony abnormality is seen.  IMPRESSION: No active lung disease.   Original Report Authenticated By: Dwyane Dee, M.D.     1. URI (upper respiratory infection)       MDM  Pt with URI symptoms for 5 days, worsening cough. CXR negative. Explained  this is most likely a viral process. Will treat symptomatically. Given z-pack in case not improving. Discussed to hold on to it unless not feeling better in 2-3 days. Pt agrees. Inhaler given in ed to help with cough. Vs normal. D/c home in stable condition.   Filed Vitals:   05/08/13 0831 05/08/13 0843  BP: 124/68   Pulse: 81   Temp: 98.5 F (36.9 C)   TempSrc: Oral   Resp: 18   Height:  5\' 8"  (1.727 m)  Weight:  205 lb (92.987 kg)  SpO2: 100%           Myriam Jacobson Anvay Tennis, PA-C 05/08/13 1628

## 2013-06-09 ENCOUNTER — Emergency Department (HOSPITAL_COMMUNITY)
Admission: EM | Admit: 2013-06-09 | Discharge: 2013-06-09 | Disposition: A | Discharge status: Home / Self Care | Payer: BC Managed Care – PPO | Attending: Emergency Medicine | Admitting: Emergency Medicine

## 2013-06-09 ENCOUNTER — Encounter (HOSPITAL_COMMUNITY): Payer: Self-pay

## 2013-06-09 DIAGNOSIS — M25579 Pain in unspecified ankle and joints of unspecified foot: Secondary | ICD-10-CM | POA: Insufficient documentation

## 2013-06-09 DIAGNOSIS — M79672 Pain in left foot: Secondary | ICD-10-CM

## 2013-06-09 DIAGNOSIS — M79609 Pain in unspecified limb: Secondary | ICD-10-CM | POA: Insufficient documentation

## 2013-06-09 DIAGNOSIS — Z79899 Other long term (current) drug therapy: Secondary | ICD-10-CM | POA: Insufficient documentation

## 2013-06-09 DIAGNOSIS — F17200 Nicotine dependence, unspecified, uncomplicated: Secondary | ICD-10-CM | POA: Insufficient documentation

## 2013-06-09 NOTE — ED Notes (Signed)
Pt reports that she has been having left foot pain for 3 days, the pain is on the top of her foot, denies any known injury.

## 2013-06-09 NOTE — ED Provider Notes (Signed)
History  This chart was scribed for Joya Gaskins, MD by Bennett Scrape, ED Scribe. This patient was seen in room APA06/APA06 and the patient's care was started at 7:31 AM.  CSN: [id number]  Arrival date & time 06/09/13  0718   First MD Initiated Contact with Patient 06/09/13 0731     Chief Complaint  Patient presents with  . Foot Pain    Patient is a 27 y.o. female presenting with lower extremity pain. The history is provided by the patient. No language interpreter was used.  Foot Pain This is a new problem. The current episode started more than 2 days ago. The problem occurs constantly. The symptoms are aggravated by walking. Nothing relieves the symptoms. She has tried nothing for the symptoms.    HPI Comments: Cindy Mcguire is a 27 y.o. female who presents to the Emergency Department complaining of 3 days of left foot pain along the dorsal aspect only that radiates shooting pains up into her left shin with ambulation. She reports that the pain is worse with bending her toes and was at its most severe last night while working. She denies any recent falls or injuries and denies any recent changes in footwear.. She denies having prior episodes of similar symptoms. She reports that she walks "a lot" at work but denies that this is a new activity for her. She denies having any h/o DM, DVT or PE.  She denies nausea and diarrhea as associated symptoms. Pt does not have a h/o chronic medical conditions.  PMH - none  History reviewed. No pertinent past surgical history.  No family history on file.  History  Substance Use Topics  . Smoking status: Current Every Day Smoker -- 1.00 packs/day    Types: Cigarettes  . Smokeless tobacco: Not on file  . Alcohol Use: No   No OB history provided.   Review of Systems  Gastrointestinal: Negative for nausea and diarrhea.  Musculoskeletal: Positive for arthralgias (left foot).    Allergies  Review of patient's allergies indicates no  known allergies.  Home Medications   Current Outpatient Rx  Name  Route  Sig  Dispense  Refill  . azithromycin (ZITHROMAX) 250 MG tablet   Oral   Take 1 tablet (250 mg total) by mouth daily. Take first 2 tablets together, then 1 every day until finished.   6 tablet   0   . benzonatate (TESSALON) 100 MG capsule   Oral   Take 1 capsule (100 mg total) by mouth every 8 (eight) hours.   21 capsule   0   . cetirizine-pseudoephedrine (ZYRTEC-D) 5-120 MG per tablet   Oral   Take 1 tablet by mouth 2 (two) times daily.   30 tablet   0    Triage Vitals: BP 117/63  Pulse 78  Temp(Src) 98.1 F (36.7 C) (Oral)  Resp 18  Ht 5\' 8"  (1.727 m)  Wt 205 lb (92.987 kg)  BMI 31.18 kg/m2  SpO2 98%  LMP 05/29/2013  Physical Exam  Nursing note and vitals reviewed.  CONSTITUTIONAL: Well developed/well nourished HEAD: Normocephalic/atraumatic EYES: EOMI/PERRL ENMT: Mucous membranes moist NECK: supple no meningeal signs CV: S1/S2 noted, no murmurs/rubs/gallops noted LUNGS: Lungs are clear to auscultation bilaterally, no apparent distress NEURO: Pt is awake/alert, moves all extremitiesx4 EXTREMITIES: pulses normal, full ROM, tenderness along the dorsal aspect of the left foot, no bony deformities, no erythema, no ecchymosis, no edema, no calf tenderness, full ROM of left ankle and left toes  SKIN: warm, color normal PSYCH: no abnormalities of mood noted  ED Course  Procedures (including critical care time)  DIAGNOSTIC STUDIES: Oxygen Saturation is 98% on room air, normal by my interpretation.    COORDINATION OF CARE: 7:41 AM-I offered xray after discussion, but she declined and would like to try NWB and f/u with ortho.  I told her stress fracture is possible and encouraged followup. Discussed discharge plan which includes post-op shoe when not working, frequent ibuprofen and elevation with pt and pt agreed to plan. Pt states that she has crutches at home and is requesting a work note  stating that she must use crutches. Also advised pt to follow up with PCP or orthopedic referral as needed and pt agreed. Addressed symptoms to return for with pt.     MDM  Nursing notes including past medical history and social history reviewed and considered in documentation     I personally performed the services described in this documentation, which was scribed in my presence. The recorded information has been reviewed and is accurate.      Joya Gaskins, MD 06/09/13 [phone]

## 2013-09-14 ENCOUNTER — Encounter (HOSPITAL_COMMUNITY): Payer: Self-pay | Admitting: Emergency Medicine

## 2013-09-14 ENCOUNTER — Emergency Department (HOSPITAL_COMMUNITY)
Admission: EM | Admit: 2013-09-14 | Discharge: 2013-09-14 | Disposition: A | Discharge status: Home / Self Care | Payer: BC Managed Care – PPO | Attending: Emergency Medicine | Admitting: Emergency Medicine

## 2013-09-14 DIAGNOSIS — N898 Other specified noninflammatory disorders of vagina: Secondary | ICD-10-CM | POA: Insufficient documentation

## 2013-09-14 DIAGNOSIS — R109 Unspecified abdominal pain: Secondary | ICD-10-CM | POA: Insufficient documentation

## 2013-09-14 DIAGNOSIS — B9689 Other specified bacterial agents as the cause of diseases classified elsewhere: Secondary | ICD-10-CM | POA: Insufficient documentation

## 2013-09-14 DIAGNOSIS — R112 Nausea with vomiting, unspecified: Secondary | ICD-10-CM | POA: Insufficient documentation

## 2013-09-14 DIAGNOSIS — A499 Bacterial infection, unspecified: Secondary | ICD-10-CM | POA: Insufficient documentation

## 2013-09-14 DIAGNOSIS — R209 Unspecified disturbances of skin sensation: Secondary | ICD-10-CM | POA: Insufficient documentation

## 2013-09-14 DIAGNOSIS — F17200 Nicotine dependence, unspecified, uncomplicated: Secondary | ICD-10-CM | POA: Insufficient documentation

## 2013-09-14 DIAGNOSIS — N939 Abnormal uterine and vaginal bleeding, unspecified: Secondary | ICD-10-CM

## 2013-09-14 DIAGNOSIS — N760 Acute vaginitis: Secondary | ICD-10-CM | POA: Insufficient documentation

## 2013-09-14 DIAGNOSIS — Z3202 Encounter for pregnancy test, result negative: Secondary | ICD-10-CM | POA: Insufficient documentation

## 2013-09-14 LAB — URINALYSIS, ROUTINE W REFLEX MICROSCOPIC
Glucose, UA: NEGATIVE mg/dL
Leukocytes, UA: NEGATIVE
Nitrite: NEGATIVE
Protein, ur: NEGATIVE mg/dL
Urobilinogen, UA: 0.2 mg/dL (ref 0.0–1.0)

## 2013-09-14 LAB — WET PREP, GENITAL: Trich, Wet Prep: NONE SEEN

## 2013-09-14 LAB — POCT PREGNANCY, URINE: Preg Test, Ur: NEGATIVE

## 2013-09-14 MED ORDER — OXYCODONE-ACETAMINOPHEN 5-325 MG PO TABS
1.0000 | ORAL_TABLET | Freq: Once | ORAL | Status: AC
  Administered 2013-09-14: 1 via ORAL
  Filled 2013-09-14: qty 1

## 2013-09-14 MED ORDER — METRONIDAZOLE 500 MG PO TABS: 500.0000 mg | ORAL_TABLET | Freq: Two times a day (BID) | ORAL | Status: DC

## 2013-09-14 NOTE — ED Notes (Signed)
Instructions, prescriptions, and f/u information given/reviewed.  Alert, in no apparent distress; denies pain.  Left in c/o mother for transport home.

## 2013-09-14 NOTE — ED Notes (Signed)
C/o vaginal burning that radiates to abd; reports n/v also; states onset of symptoms at 0930 this morning.

## 2013-09-14 NOTE — ED Provider Notes (Signed)
CSN: [id number]     Arrival date & time 09/14/13  1115 History  This chart was scribed for Joya Gaskins, MD by Karle Plumber, ED Scribe. This patient was seen in room APA09/APA09 and the patient's care was started at 11:46 AM.  Chief Complaint  Patient presents with  . Vaginal Pain  . Emesis   Patient is a 27 y.o. female presenting with vaginal pain and vomiting. The history is provided by the patient. No language interpreter was used.  Vaginal Pain This is a new problem. The current episode started 3 to 5 hours ago. The problem occurs constantly. The problem has not changed since onset.Associated symptoms include abdominal pain (radiates from vaginal burning). Pertinent negatives include no chest pain, no headaches and no shortness of breath. She has tried nothing for the symptoms.  Emesis Associated symptoms: abdominal pain (radiates from vaginal burning)   Associated symptoms: no headaches    HPI Comments:  Cindy Mcguire is a 27 y.o. female who presents to the Emergency Department complaining of severe vaginal burning that radiates into abdomen onset 3 hours ago. She reports associated nausea and vomiting. Pt states she felt normal when she left work this morning, but then started experiencing the symptoms. She also reports associated tingling in fingers and toes. She denies any rash. Her mother is in the room and states pt has had several miscarriages in the past.  Patient gives consent to d/w mother her medical conditions  PMH - none History reviewed. No pertinent past surgical history. No family history on file. History  Substance Use Topics  . Smoking status: Current Every Day Smoker -- 1.00 packs/day    Types: Cigarettes  . Smokeless tobacco: Not on file  . Alcohol Use: No   OB History   Grav Para Term Preterm Abortions TAB SAB Ect Mult Living                 Review of Systems  Respiratory: Negative for shortness of breath.   Cardiovascular: Negative for chest  pain.  Gastrointestinal: Positive for nausea, vomiting and abdominal pain (radiates from vaginal burning).  Genitourinary: Positive for vaginal pain.  Neurological: Negative for headaches.  All other systems reviewed and are negative.    Allergies  Review of patient's allergies indicates no known allergies.  Home Medications  No current outpatient prescriptions on file. Triage Vitals:  BP 144/76  Pulse 78  Temp(Src) 97.9 F (36.6 C) (Oral)  Resp 20  Ht 5\' 8"  (1.727 m)  Wt 218 lb (98.884 kg)  BMI 33.15 kg/m2  SpO2 98%  LMP 09/14/2013  Physical Exam CONSTITUTIONAL: Well developed/well nourished HEAD: Normocephalic/atraumatic EYES: EOMI/PERRL ENMT: Mucous membranes moist NECK: supple no meningeal signs SPINE:entire spine nontender CV: S1/S2 noted, no murmurs/rubs/gallops noted LUNGS: Lungs are clear to auscultation bilaterally, no apparent distress ABDOMEN: soft, nontender, no rebound or guarding GU:no cva tenderness. Blood noted in vaginal vault.  No active bleeding. Os closed.  No cmt.  No adnexal mass/tenderness.  No herpetic lesions noted.  No signs of trauma and no laceration noted Female chaperone present NEURO: Pt is awake/alert, moves all extremitiesx4 EXTREMITIES: pulses normal, full ROM SKIN: warm, color normal PSYCH: no abnormalities of mood noted  ED Course  Procedures (including critical care time) DIAGNOSTIC STUDIES: Oxygen Saturation is 98% on RA, normal by my interpretation.   COORDINATION OF CARE: 11:52 AM- Will do urinalysis and perform pelvic exam. Pt verbalizes understanding and agrees to plan.   BV noted.  Otherwise  no other acute abnormality, her abdominal exam is unremarkable and she feels improved Advised need for f/u as outpatient and we discussed strict return precautions Labs Review Labs Reviewed  WET PREP, GENITAL - Abnormal; Notable for the following:    Clue Cells Wet Prep HPF POC MODERATE (*)    All other components within normal  limits  GC/CHLAMYDIA PROBE AMP  URINALYSIS, ROUTINE W REFLEX MICROSCOPIC  POCT PREGNANCY, URINE   Imaging Review No results found.  EKG Interpretation   None       MDM  No diagnosis found. Nursing notes including past medical history and social history reviewed and considered in documentation Labs/vital reviewed and considered   I personally performed the services described in this documentation, which was scribed in my presence. The recorded information has been reviewed and is accurate.    Joya Gaskins, MD 09/14/13 [phone]

## 2013-09-15 LAB — GC/CHLAMYDIA PROBE AMP: CT Probe RNA: NEGATIVE

## 2013-10-11 ENCOUNTER — Encounter: Payer: Self-pay | Admitting: *Deleted

## 2013-10-24 ENCOUNTER — Encounter: Payer: Self-pay | Admitting: Obstetrics & Gynecology

## 2014-02-06 ENCOUNTER — Emergency Department (HOSPITAL_COMMUNITY): Payer: BC Managed Care – PPO

## 2014-02-06 ENCOUNTER — Encounter (HOSPITAL_COMMUNITY): Payer: Self-pay | Admitting: Emergency Medicine

## 2014-02-06 ENCOUNTER — Emergency Department (HOSPITAL_COMMUNITY)
Admission: EM | Admit: 2014-02-06 | Discharge: 2014-02-06 | Disposition: A | Discharge status: Home / Self Care | Payer: BC Managed Care – PPO | Attending: Emergency Medicine | Admitting: Emergency Medicine

## 2014-02-06 DIAGNOSIS — F17200 Nicotine dependence, unspecified, uncomplicated: Secondary | ICD-10-CM | POA: Insufficient documentation

## 2014-02-06 DIAGNOSIS — J40 Bronchitis, not specified as acute or chronic: Secondary | ICD-10-CM | POA: Insufficient documentation

## 2014-02-06 DIAGNOSIS — Z79899 Other long term (current) drug therapy: Secondary | ICD-10-CM | POA: Insufficient documentation

## 2014-02-06 DIAGNOSIS — Z3202 Encounter for pregnancy test, result negative: Secondary | ICD-10-CM | POA: Insufficient documentation

## 2014-02-06 DIAGNOSIS — Z8701 Personal history of pneumonia (recurrent): Secondary | ICD-10-CM | POA: Insufficient documentation

## 2014-02-06 HISTORY — DX: Bronchitis, not specified as acute or chronic: J40

## 2014-02-06 HISTORY — DX: Pneumonia, unspecified organism: J18.9

## 2014-02-06 LAB — POC URINE PREG, ED: Preg Test, Ur: NEGATIVE

## 2014-02-06 MED ORDER — ALBUTEROL SULFATE HFA 108 (90 BASE) MCG/ACT IN AERS
2.0000 | INHALATION_SPRAY | Freq: Four times a day (QID) | RESPIRATORY_TRACT | Status: DC
  Administered 2014-02-06: 2 via RESPIRATORY_TRACT
  Filled 2014-02-06: qty 6.7

## 2014-02-06 MED ORDER — DM-GUAIFENESIN ER 30-600 MG PO TB12: 1.0000 | ORAL_TABLET | Freq: Two times a day (BID) | ORAL | Status: DC

## 2014-02-06 NOTE — Discharge Instructions (Signed)
Bronchitis °Bronchitis is swelling (inflammation) of the air tubes leading to your lungs (bronchi). This causes mucus and a cough. If the swelling gets bad, you may have trouble breathing. °HOME CARE  °· Rest. °· Drink enough fluids to keep your pee (urine) clear or pale yellow (unless you have a condition where you have to watch how much you drink). °· Only take medicine as told by your doctor. If you were given antibiotic medicines, finish them even if you start to feel better. °· Avoid smoke, irritating chemicals, and strong smells. These make the problem worse. Quit smoking if you smoke. This helps your lungs heal faster. °· Use a cool mist humidifier. Change the water in the humidifier every day. You can also sit in the bathroom with hot shower running for 5 10 minutes. Keep the door closed. °· See your health care provider as told. °· Wash your hands often. °GET HELP IF: °Your problems do not get better after 1 week. °GET HELP RIGHT AWAY IF:  °· Your fever gets worse. °· You have chills. °· Your chest hurts. °· Your problems breathing get worse. °· You have blood in your mucus. °· You pass out (faint). °· You feel lightheaded. °· You have a bad headache. °· You throw up (vomit) again and again. °MAKE SURE YOU: °· Understand these instructions. °· Will watch your condition. °· Will get help right away if you are not doing well or get worse. °Document Released: 05/03/2008 Document Revised: 09/05/2013 Document Reviewed: 07/10/2013 °Exit[employer]® Patient Information ©2014 [employer]. ° °

## 2014-02-06 NOTE — ED Notes (Signed)
Non productive cough x 1 wk with sob and night sweats.

## 2014-02-06 NOTE — ED Provider Notes (Signed)
CSN: [id number]     Arrival date & time 02/06/14  [id number] History   First MD Initiated Contact with Patient 02/06/14 [phone]     Chief Complaint  Patient presents with  . Cough     (Consider location/radiation/quality/duration/timing/severity/associated sxs/prior Treatment) Patient is a 28 y.o. female presenting with cough. The history is provided by the patient.  Cough Associated symptoms: shortness of breath and wheezing   Associated symptoms: no chest pain, no fever, no headaches and no rash    patient with one-week history of cough nonproductive but raspy associated with some wheezing 2 days ago started with nasal congestion. Fevers feelings and night sweats. No nausea vomiting or diarrhea.  Past Medical History  Diagnosis Date  . Medical history non-contributory   . Pneumonia   . Bronchitis    Past Surgical History  Procedure Laterality Date  . Wisdom tooth extraction     No family history on file. History  Substance Use Topics  . Smoking status: Current Every Day Smoker -- 1.00 packs/day    Types: Cigarettes  . Smokeless tobacco: Not on file  . Alcohol Use: No   OB History   Grav Para Term Preterm Abortions TAB SAB Ect Mult Living                 Review of Systems  Constitutional: Negative for fever.  HENT: Positive for congestion.   Eyes: Negative for redness.  Respiratory: Positive for cough, shortness of breath and wheezing.   Cardiovascular: Negative for chest pain.  Gastrointestinal: Negative for nausea, vomiting and abdominal pain.  Genitourinary: Negative for dysuria.  Musculoskeletal: Negative for back pain.  Skin: Negative for rash.  Neurological: Negative for headaches.  Hematological: Does not bruise/bleed easily.  Psychiatric/Behavioral: Negative for confusion.      Allergies  Review of patient's allergies indicates no known allergies.  Home Medications   Current Outpatient Rx  Name  Route  Sig  Dispense  Refill  . Pseudoephedrine-APAP-DM  (TYLENOL COLD/FLU SEVERE DAY PO)   Oral   Take 30 mLs by mouth daily as needed (cold/cough).         Marland Kitchen. dextromethorphan-guaiFENesin (MUCINEX DM) 30-600 MG per 12 hr tablet   Oral   Take 1 tablet by mouth 2 (two) times daily.   14 tablet   1    BP 128/76  Pulse 105  Temp(Src) 98.1 F (36.7 C) (Oral)  Resp 16  Ht 5\' 8"  (1.727 m)  Wt 228 lb (103.42 kg)  BMI 34.68 kg/m2  SpO2 97%  LMP 01/16/2014 Physical Exam  Nursing note and vitals reviewed. Constitutional: She is oriented to person, place, and time. She appears well-developed and well-nourished. No distress.  HENT:  Head: Normocephalic and atraumatic.  Mouth/Throat: Oropharynx is clear and moist.  Eyes: Conjunctivae and EOM are normal. Pupils are equal, round, and reactive to light.  Neck: Normal range of motion.  Cardiovascular: Normal rate, regular rhythm and normal heart sounds.   No murmur heard. Pulmonary/Chest: Effort normal and breath sounds normal. No respiratory distress.  Abdominal: Soft. Bowel sounds are normal. There is no tenderness.  Musculoskeletal: Normal range of motion.  Neurological: She is alert and oriented to person, place, and time. No cranial nerve deficit. She exhibits normal muscle tone. Coordination normal.  Skin: Skin is warm. No rash noted.    ED Course  Procedures (including critical care time) Labs Review Labs Reviewed  POC URINE PREG, ED   Imaging Review Dg Chest 2 View  02/06/2014  CLINICAL DATA Cough/congestion, substernal chest pain, shortness of breath with exertion  EXAM CHEST  2 VIEW  COMPARISON 05/08/2013  FINDINGS Mild right basilar atelectasis. No focal consolidation. No pleural effusion or pneumothorax.  The heart is normal in size.  Visualized osseous structures are within normal limits.  IMPRESSION No evidence of acute cardiopulmonary disease.  SIGNATURE  Electronically Signed   By: Charline Bills M.D.   On: 02/06/2014 08:05     EKG Interpretation None      MDM    Final diagnoses:  Bronchitis    Patient with upper respiratory infection symptoms as well as a nonproductive cough. Clinically consistent with bronchitis. Patient with subjective fevers but afebrile here. Chest x-ray negative for pneumonia we'll treat as upper for infection and bronchitis with albuterol inhaler and Mucinex DM. Patient not wheezing here but at home felt as if she was wheezing. Patient nontoxic no acute distress.    Shelda Jakes, MD 02/06/14 [phone]

## 2014-02-06 NOTE — ED Notes (Signed)
X ray states needs poc preg prior to x ray. Ordered.

## 2014-06-07 ENCOUNTER — Emergency Department (HOSPITAL_COMMUNITY)
Admission: EM | Admit: 2014-06-07 | Discharge: 2014-06-07 | Disposition: A | Discharge status: Home / Self Care | Payer: 59 | Attending: Emergency Medicine | Admitting: Emergency Medicine

## 2014-06-07 ENCOUNTER — Emergency Department (HOSPITAL_COMMUNITY): Payer: 59

## 2014-06-07 ENCOUNTER — Encounter (HOSPITAL_COMMUNITY): Payer: Self-pay | Admitting: Emergency Medicine

## 2014-06-07 DIAGNOSIS — Z79899 Other long term (current) drug therapy: Secondary | ICD-10-CM | POA: Diagnosis not present

## 2014-06-07 DIAGNOSIS — R0789 Other chest pain: Secondary | ICD-10-CM

## 2014-06-07 DIAGNOSIS — Z8701 Personal history of pneumonia (recurrent): Secondary | ICD-10-CM | POA: Insufficient documentation

## 2014-06-07 DIAGNOSIS — R071 Chest pain on breathing: Secondary | ICD-10-CM | POA: Insufficient documentation

## 2014-06-07 DIAGNOSIS — F17200 Nicotine dependence, unspecified, uncomplicated: Secondary | ICD-10-CM | POA: Diagnosis not present

## 2014-06-07 DIAGNOSIS — R079 Chest pain, unspecified: Secondary | ICD-10-CM | POA: Diagnosis present

## 2014-06-07 DIAGNOSIS — Z8709 Personal history of other diseases of the respiratory system: Secondary | ICD-10-CM | POA: Insufficient documentation

## 2014-06-07 LAB — CBC WITH DIFFERENTIAL/PLATELET
BASOS PCT: 1 % (ref 0–1)
Basophils Absolute: 0.1 10*3/uL (ref 0.0–0.1)
EOS ABS: 0.3 10*3/uL (ref 0.0–0.7)
Eosinophils Relative: 4 % (ref 0–5)
HCT: 43.8 % (ref 36.0–46.0)
Hemoglobin: 15.1 g/dL — ABNORMAL HIGH (ref 12.0–15.0)
Lymphocytes Relative: 32 % (ref 12–46)
Lymphs Abs: 2.5 10*3/uL (ref 0.7–4.0)
MCH: 32.5 pg (ref 26.0–34.0)
MCHC: 34.5 g/dL (ref 30.0–36.0)
MCV: 94.4 fL (ref 78.0–100.0)
Monocytes Absolute: 0.8 10*3/uL (ref 0.1–1.0)
Monocytes Relative: 11 % (ref 3–12)
NEUTROS PCT: 52 % (ref 43–77)
Neutro Abs: 3.9 10*3/uL (ref 1.7–7.7)
PLATELETS: 349 10*3/uL (ref 150–400)
RBC: 4.64 MIL/uL (ref 3.87–5.11)
RDW: 12.2 % (ref 11.5–15.5)
WBC: 7.6 10*3/uL (ref 4.0–10.5)

## 2014-06-07 LAB — BASIC METABOLIC PANEL
Anion gap: 10 (ref 5–15)
BUN: 13 mg/dL (ref 6–23)
CALCIUM: 9.5 mg/dL (ref 8.4–10.5)
CO2: 28 mEq/L (ref 19–32)
CREATININE: 0.84 mg/dL (ref 0.50–1.10)
Chloride: 102 mEq/L (ref 96–112)
Glucose, Bld: 105 mg/dL — ABNORMAL HIGH (ref 70–99)
POTASSIUM: 5.1 meq/L (ref 3.7–5.3)
Sodium: 140 mEq/L (ref 137–147)

## 2014-06-07 LAB — TROPONIN I

## 2014-06-07 MED ORDER — MORPHINE SULFATE 4 MG/ML IJ SOLN
4.0000 mg | Freq: Once | INTRAMUSCULAR | Status: AC
  Administered 2014-06-07: 4 mg via INTRAVENOUS
  Filled 2014-06-07: qty 1

## 2014-06-07 MED ORDER — KETOROLAC TROMETHAMINE 30 MG/ML IJ SOLN
30.0000 mg | Freq: Once | INTRAMUSCULAR | Status: AC
  Administered 2014-06-07: 30 mg via INTRAVENOUS
  Filled 2014-06-07: qty 1

## 2014-06-07 MED ORDER — NAPROXEN 500 MG PO TABS: 500.0000 mg | ORAL_TABLET | Freq: Two times a day (BID) | ORAL | Status: DC

## 2014-06-07 MED ORDER — ONDANSETRON HCL 4 MG/2ML IJ SOLN
4.0000 mg | Freq: Once | INTRAMUSCULAR | Status: AC
Start: 2014-06-07 — End: 2014-06-07
  Administered 2014-06-07: 4 mg via INTRAVENOUS
  Filled 2014-06-07: qty 2

## 2014-06-07 MED ORDER — CYCLOBENZAPRINE HCL 10 MG PO TABS: 10.0000 mg | ORAL_TABLET | Freq: Two times a day (BID) | ORAL | Status: DC | PRN

## 2014-06-07 NOTE — Discharge Instructions (Signed)
Chest Wall Pain Chest wall pain is pain felt in or around the chest bones and muscles. It may take up to 6 weeks to get better. It may take longer if you are active. Chest wall pain can happen on its own. Other times, things like germs, injury, coughing, or exercise can cause the pain. HOME CARE   Avoid activities that make you tired or cause pain. Try not to use your chest, belly (abdominal), or side muscles. Do not use heavy weights.  Put ice on the sore area.  Put ice in a plastic bag.  Place a towel between your skin and the bag.  Leave the ice on for 15-20 minutes for the first 2 days.  Only take medicine as told by your doctor. GET HELP RIGHT AWAY IF:   You have more pain or are very uncomfortable.  You have a fever.  Your chest pain gets worse.  You have new problems.  You feel sick to your stomach (nauseous) or throw up (vomit).  You start to sweat or feel lightheaded.  You have a cough with mucus (phlegm).  You cough up blood. MAKE SURE YOU:   Understand these instructions.  Will watch your condition.  Will get help right away if you are not doing well or get worse. Document Released: 05/03/2008 Document Revised: 02/07/2012 Document Reviewed: 07/12/2011 [facility] Patient Information 2015 [place], [place]. This information is not intended to replace advice given to you by your health care provider. Make sure you discuss any questions you have with your health care provider.   Tests were normal. Medication for pain and muscle relaxation. Followup your primary care Dr.

## 2014-06-07 NOTE — ED Notes (Signed)
Pt reports chest pain since 1am this am. Pt reports pain worse with movement and a deep breath. Pt moderately anxious in triage. Pt tearful. Pt denies n/v. Pt non-diaphoretic.

## 2014-06-07 NOTE — ED Provider Notes (Signed)
CSN: [id number]     Arrival date & time 06/07/14  4098 History  This chart was scribed for Donnetta Hutching, MD by Leone Payor, ED Scribe. This patient was seen in room APA19/APA19 and the patient's care was started 7:49 AM.    Chief Complaint  Patient presents with  . Chest Pain    The history is provided by the patient. No language interpreter was used.    HPI Comments: Cindy Mcguire is a 28 y.o. female who presents to the Emergency Department complaining of sudden onset, constant, unchanged substernal chest pain that began 7 hours ago. Cindy Mcguire denies recent injury or strenuous activity. Cindy Mcguire describes the pain as tightness and states the pain is worse with leaning back or laying supine. Cindy Mcguire denies similar symptoms in the past. Cindy Mcguire denies SOB, diaphoresis, nausea, vomiting, abdominal pain, leg swelling or pain. Cindy Mcguire denies personal or family history of cardiac disease.   Past Medical History  Diagnosis Date  . Medical history non-contributory   . Pneumonia   . Bronchitis    Past Surgical History  Procedure Laterality Date  . Wisdom tooth extraction     History reviewed. No pertinent family history. History  Substance Use Topics  . Smoking status: Current Every Day Smoker -- 0.75 packs/day    Types: Cigarettes  . Smokeless tobacco: Not on file  . Alcohol Use: No   OB History   Grav Para Term Preterm Abortions TAB SAB Ect Mult Living                 Review of Systems  A complete 10 system review of systems was obtained and all systems are negative except as noted in the HPI and PMH.    Allergies  Review of patient's allergies indicates no known allergies.  Home Medications   Prior to Admission medications   Medication Sig Start Date End Date Taking? Authorizing Provider  acetaminophen (TYLENOL) 500 MG tablet Take 1,000-1,500 mg by mouth 3 (three) times daily as needed for mild pain.   Yes Historical Provider, MD  cyclobenzaprine (FLEXERIL) 10 MG tablet Take 1 tablet (10 mg  total) by mouth 2 (two) times daily as needed for muscle spasms. 06/07/14   Donnetta Hutching, MD  naproxen (NAPROSYN) 500 MG tablet Take 1 tablet (500 mg total) by mouth 2 (two) times daily. 06/07/14   Donnetta Hutching, MD   BP 122/75  Pulse 61  Temp(Src) 98 F (36.7 C) (Oral)  Resp 15  Ht 5\' 8"  (1.727 m)  Wt 220 lb (99.791 kg)  BMI 33.46 kg/m2  SpO2 99%  LMP 05/31/2014 Physical Exam  Nursing note and vitals reviewed. Constitutional: Cindy Mcguire is oriented to person, place, and time. Cindy Mcguire appears well-developed and well-nourished.  HENT:  Head: Normocephalic and atraumatic.  Eyes: Conjunctivae and EOM are normal. Pupils are equal, round, and reactive to light.  Neck: Normal range of motion. Neck supple.  Cardiovascular: Normal rate, regular rhythm and normal heart sounds.   Pulmonary/Chest: Effort normal and breath sounds normal. Cindy Mcguire exhibits tenderness.  Tenderness to palpation over sternum.   Abdominal: Soft. Bowel sounds are normal.  Musculoskeletal: Normal range of motion.  Neurological: Cindy Mcguire is alert and oriented to person, place, and time.  Skin: Skin is warm and dry.  Psychiatric: Cindy Mcguire has a normal mood and affect. Her behavior is normal.    ED Course  Procedures (including critical care time)  DIAGNOSTIC STUDIES: Oxygen Saturation is 100% on RA, normal by my interpretation.    COORDINATION  OF CARE: 7:50 AM Discussed treatment plan with pt at bedside and pt agreed to plan.   Labs Review Labs Reviewed  BASIC METABOLIC PANEL - Abnormal; Notable for the following:    Glucose, Bld 105 (*)    All other components within normal limits  CBC WITH DIFFERENTIAL - Abnormal; Notable for the following:    Hemoglobin 15.1 (*)    All other components within normal limits  TROPONIN I    Imaging Review Dg Chest 2 View  06/07/2014   CLINICAL DATA:  Chest pain, history of smoking  EXAM: CHEST  2 VIEW  COMPARISON:  02/06/2014; 05/08/2013  FINDINGS: Grossly unchanged cardiac silhouette and mediastinal  contours. Veiling opacities overlying the bilateral lower lungs are favored to represent overlying breast tissues. No focal airspace opacities. No pleural effusion or pneumothorax. No evidence of edema. No acute osseus abnormalities.  IMPRESSION: No acute cardiopulmonary disease.   Electronically Signed   By: Simonne ComeJohn  Watts M.D.   On: 06/07/2014 08:20     EKG Interpretation   Date/Time:  Friday June 07 2014 07:53:19 EDT Ventricular Rate:  77 PR Interval:  131 QRS Duration: 82 QT Interval:  367 QTC Calculation: 415 R Axis:   52 Text Interpretation:  Sinus rhythm Low voltage, precordial leads Confirmed  by Evadene Wardrip  MD, Jaheim Canino ([id number]) on 06/07/2014 9:16:41 AM      MDM   Final diagnoses:  Chest wall pain   Patient is low risk for acute coronary syndrome or pulmonary embolism. Pain appears to be more musculoskeletal. Screening tests all normal. Cindy Mcguire feels better after IV morphine and IV Toradol. Discharge medications Naprosyn 500 mg and Flexeril 10 mg  I personally performed the services described in this documentation, which was scribed in my presence. The recorded information has been reviewed and is accurate.   Donnetta HutchingBrian Shoua Ressler, MD 06/07/14 1045

## 2014-06-10 NOTE — ED Notes (Signed)
Pt called today stating she needed a work note. States she was offered one the day of her visit but didn't feel she needed one. Pt request a note from 7/11 - 7/14. Pt made aware she can have a note for three days only.

## 2015-11-24 ENCOUNTER — Encounter (HOSPITAL_COMMUNITY): Payer: Self-pay | Admitting: *Deleted

## 2015-11-24 ENCOUNTER — Emergency Department (HOSPITAL_COMMUNITY)
Admission: EM | Admit: 2015-11-24 | Discharge: 2015-11-24 | Disposition: A | Discharge status: Home / Self Care | Payer: 59 | Attending: Emergency Medicine | Admitting: Emergency Medicine

## 2015-11-24 ENCOUNTER — Emergency Department (HOSPITAL_COMMUNITY): Payer: 59

## 2015-11-24 DIAGNOSIS — F17210 Nicotine dependence, cigarettes, uncomplicated: Secondary | ICD-10-CM | POA: Insufficient documentation

## 2015-11-24 DIAGNOSIS — F419 Anxiety disorder, unspecified: Secondary | ICD-10-CM | POA: Insufficient documentation

## 2015-11-24 DIAGNOSIS — R42 Dizziness and giddiness: Secondary | ICD-10-CM | POA: Insufficient documentation

## 2015-11-24 DIAGNOSIS — Z8709 Personal history of other diseases of the respiratory system: Secondary | ICD-10-CM | POA: Insufficient documentation

## 2015-11-24 DIAGNOSIS — N390 Urinary tract infection, site not specified: Secondary | ICD-10-CM

## 2015-11-24 DIAGNOSIS — R079 Chest pain, unspecified: Secondary | ICD-10-CM

## 2015-11-24 DIAGNOSIS — Z8701 Personal history of pneumonia (recurrent): Secondary | ICD-10-CM | POA: Insufficient documentation

## 2015-11-24 DIAGNOSIS — R55 Syncope and collapse: Secondary | ICD-10-CM

## 2015-11-24 DIAGNOSIS — Z3202 Encounter for pregnancy test, result negative: Secondary | ICD-10-CM | POA: Insufficient documentation

## 2015-11-24 LAB — URINE MICROSCOPIC-ADD ON: RBC / HPF: NONE SEEN RBC/hpf (ref 0–5)

## 2015-11-24 LAB — URINALYSIS, ROUTINE W REFLEX MICROSCOPIC
BILIRUBIN URINE: NEGATIVE
Glucose, UA: NEGATIVE mg/dL
Hgb urine dipstick: NEGATIVE
Ketones, ur: NEGATIVE mg/dL
Nitrite: POSITIVE — AB
PH: 5.5 (ref 5.0–8.0)
Protein, ur: NEGATIVE mg/dL
SPECIFIC GRAVITY, URINE: 1.025 (ref 1.005–1.030)

## 2015-11-24 LAB — I-STAT CHEM 8, ED
BUN: 9 mg/dL (ref 6–20)
CREATININE: 0.7 mg/dL (ref 0.44–1.00)
Calcium, Ion: 1.16 mmol/L (ref 1.12–1.23)
Chloride: 102 mmol/L (ref 101–111)
Glucose, Bld: 85 mg/dL (ref 65–99)
HCT: 44 % (ref 36.0–46.0)
Hemoglobin: 15 g/dL (ref 12.0–15.0)
POTASSIUM: 3.9 mmol/L (ref 3.5–5.1)
Sodium: 141 mmol/L (ref 135–145)
TCO2: 27 mmol/L (ref 0–100)

## 2015-11-24 LAB — POC URINE PREG, ED: Preg Test, Ur: NEGATIVE

## 2015-11-24 MED ORDER — CEPHALEXIN 500 MG PO CAPS: 500.0000 mg | ORAL_CAPSULE | Freq: Four times a day (QID) | ORAL | Status: DC

## 2015-11-24 NOTE — ED Provider Notes (Signed)
CSN: [id number]     Arrival date & time 11/24/15  2113 History  By signing my name below, I, [facility], attest that this documentation has been prepared under the direction and in the presence of Zadie Rhine, MD. Electronically Signed: Randell Patient, ED Scribe. 11/24/2015. 10:53 PM.    Chief Complaint  Patient presents with  . Dizziness   HPI Comments: Patient reports that she has had 2 episodes of left-sided chest pain that she likens to heart burn, followed by dizziness and syncope or near-syncope. She notes that she is inactive when chest pain presents. She notes burning, sharp, shooting pain on the left-side of her chest for 15-20 minutes, 3-4 times a day when sitting. Patient states that 3 days ago the chest pain was proceeded by dizziness and an episode of syncope. She notes similar symptoms today but notes that she did not black out.  Patient is a 29 y.o. female presenting with dizziness. The history is provided by the patient. No language interpreter was used.  Dizziness Quality:  Room spinning and imbalance Severity:  Moderate Onset quality:  Sudden Duration:  3 days Timing:  Sporadic Progression:  Resolved Chronicity:  Recurrent Context: inactivity and loss of consciousness   Worsened by:  Standing up and movement Associated symptoms: chest pain and syncope   Chest pain:    Quality: burning, sharp and shooting     Severity:  Mild   Onset quality:  Sudden   Duration:  3 days   Timing:  Sporadic   Progression:  Resolved   Chronicity:  New Risk factors: no hx of stroke   Risk factors comment:  No h/o CAD/PE/DVT  She does not take contraceptives Past Medical History  Diagnosis Date  . Medical history non-contributory   . Pneumonia   . Bronchitis    Past Surgical History  Procedure Laterality Date  . Wisdom tooth extraction     History reviewed. No pertinent family history. Social History  Substance Use Topics  . Smoking status: Current Every  Day Smoker -- 0.75 packs/day    Types: Cigarettes  . Smokeless tobacco: None  . Alcohol Use: No   OB History    No data available     Review of Systems  Constitutional: Negative for fever.  Respiratory:       Denies hemoptysis   Cardiovascular: Positive for chest pain and syncope.  Gastrointestinal: Negative for abdominal pain.  Genitourinary: Negative for dysuria.  Neurological: Positive for dizziness and syncope.  All other systems reviewed and are negative.     Allergies  Review of patient's allergies indicates no known allergies.  Home Medications   Prior to Admission medications   Not on File   BP 144/82 mmHg  Pulse 111  Temp(Src) 99 F (37.2 C) (Oral)  Resp 24  Ht  (1.727 m)  Wt 240 lb (108.863 kg)  BMI 36.50 kg/m2  SpO2 100%  LMP 11/01/2015 Physical Exam CONSTITUTIONAL: Well developed/well nourished HEAD: Normocephalic/atraumatic EYES: EOMI/PERRL ENMT: Mucous membranes moist NECK: supple no meningeal signs SPINE/BACK:entire spine nontender CV: S1/S2 noted, no murmurs/rubs/gallops noted LUNGS: Lungs are clear to auscultation bilaterally, no apparent distress ABDOMEN: soft, nontender, no rebound or guarding, bowel sounds noted throughout abdomen GU:no cva tenderness NEURO: Pt is awake/alert/appropriate, moves all extremitiesx4.  No facial droop. No arm drift. Equal hand grips noted.   EXTREMITIES: pulses normal/equal, full ROM, no calf tenderness, no edema SKIN: warm, color normal PSYCH: anxious  ED Course  Procedures  This pt  is anxious EKG unchanged from prior except for tachycardia CXR negative Labs unremarkable I doubt ACS given age/history I also have low suspicion for PE, no hypoxia noted here in the ED For her dizziness no signs of acute neurologic emergency I doubt cardiac dysrhythmia as cause of her symptoms Advised need for cardiology f/u Incidental finding of UTI noted Discussed strict return precautions  DIAGNOSTIC  STUDIES: Oxygen Saturation is 100% on RA, normal by my interpretation.    COORDINATION OF CARE: 10:41 PM Discussed treatment plan with pt at bedside and pt agreed to plan.  Labs Review Labs Reviewed  URINALYSIS, ROUTINE W REFLEX MICROSCOPIC (NOT [facility]) - Abnormal; Notable for the following:    APPearance HAZY (*)    Nitrite POSITIVE (*)    Leukocytes, UA MODERATE (*)    All other components within normal limits  URINE MICROSCOPIC-ADD ON - Abnormal; Notable for the following:    Squamous Epithelial / LPF 6-30 (*)    Bacteria, UA MANY (*)    All other components within normal limits  POC URINE PREG, ED  I-STAT CHEM 8, ED    Imaging Review Dg Chest 2 View  11/24/2015  CLINICAL DATA:  Dizziness.  Left-sided chest pain. EXAM: CHEST  2 VIEW COMPARISON:  06/07/2014 FINDINGS: The heart size and mediastinal contours are within normal limits. Both lungs are clear. The visualized skeletal structures are unremarkable. IMPRESSION: No active cardiopulmonary disease. Electronically Signed   By: Signa Kellaylor  Stroud M.D.   On: 11/24/2015 23:30   I have personally reviewed and evaluated these images and lab results as part of my medical decision-making.   EKG Interpretation   Date/Time:  Monday November 24 2015 21:35:41 EST Ventricular Rate:  111 PR Interval:  136 QRS Duration: 80 QT Interval:  332 QTC Calculation: 451 R Axis:   25 Text Interpretation:  Sinus tachycardia Otherwise normal ECG No  significant change since last tracing Confirmed by Bebe ShaggyWICKLINE  MD, Dorinda HillNALD  [phone]([id number]) on 11/24/2015 10:09:58 PM      MDM   Final diagnoses:  UTI (lower urinary tract infection)  Chest pain, unspecified chest pain type  Syncope, unspecified syncope type    Nursing notes including past medical history and social history reviewed and considered in documentation Labs/vital reviewed myself and considered during evaluation xrays/imaging reviewed by myself and considered during evaluation    I  personally performed the services described in this documentation, which was scribed in my presence. The recorded information has been reviewed and is accurate.       Zadie Rhineonald Lennan Malone, MD 11/24/15 [phone]

## 2015-11-24 NOTE — ED Notes (Addendum)
Pt states she was dizzy and she felt like things went black. Pt states she feels tingly all over. Pt also reports a shocking feeling in her upper left chest area. Pt states this happened on Friday and about 8 pm tonight.

## 2016-01-27 ENCOUNTER — Other Ambulatory Visit: Payer: Self-pay | Admitting: Obstetrics & Gynecology

## 2016-01-27 ENCOUNTER — Encounter: Payer: Self-pay | Admitting: *Deleted

## 2017-09-13 ENCOUNTER — Emergency Department (HOSPITAL_COMMUNITY)
Admission: EM | Admit: 2017-09-13 | Discharge: 2017-09-13 | Disposition: A | Discharge status: Home / Self Care | Payer: Self-pay

## 2017-09-13 NOTE — ED Notes (Signed)
No answer in waiting room X2,  

## 2017-09-13 NOTE — ED Notes (Signed)
No answer in waiting room, registration advised that pt had left,

## 2017-09-13 NOTE — ED Notes (Signed)
No answer in waiting room X3,  

## 2019-09-12 ENCOUNTER — Other Ambulatory Visit: Payer: Self-pay | Admitting: *Deleted

## 2019-09-12 DIAGNOSIS — Z20822 Contact with and (suspected) exposure to covid-19: Secondary | ICD-10-CM

## 2019-09-13 LAB — NOVEL CORONAVIRUS, NAA: SARS-CoV-2, NAA: NOT DETECTED

## 2020-01-07 ENCOUNTER — Ambulatory Visit: Payer: Self-pay | Attending: Internal Medicine

## 2020-01-07 ENCOUNTER — Other Ambulatory Visit: Payer: Self-pay

## 2020-01-07 DIAGNOSIS — Z20822 Contact with and (suspected) exposure to covid-19: Secondary | ICD-10-CM | POA: Insufficient documentation

## 2020-01-08 LAB — NOVEL CORONAVIRUS, NAA: SARS-CoV-2, NAA: NOT DETECTED

## 2020-04-23 ENCOUNTER — Encounter: Payer: Self-pay | Admitting: Emergency Medicine

## 2020-04-23 ENCOUNTER — Other Ambulatory Visit: Payer: Self-pay

## 2020-04-23 ENCOUNTER — Ambulatory Visit
Admission: EM | Admit: 2020-04-23 | Discharge: 2020-04-23 | Disposition: A | Discharge status: Home / Self Care | Payer: Self-pay | Attending: Emergency Medicine | Admitting: Emergency Medicine

## 2020-04-23 DIAGNOSIS — B850 Pediculosis due to Pediculus humanus capitis: Secondary | ICD-10-CM

## 2020-04-23 MED ORDER — IVERMECTIN 3 MG PO TABS: 200.0000 ug/kg | ORAL_TABLET | Freq: Once | ORAL | 0 refills | Status: AC

## 2020-04-23 NOTE — Discharge Instructions (Signed)
Take medication as prescribed Repeat treatment in 10 days Follow-up with PCP Please rate lice adult attachment Return or go to ED for worsening symptoms

## 2020-04-23 NOTE — ED Triage Notes (Signed)
Pt here requesting exam after trying on bathing suit ordered from [facility] yesterday that had small black bugs on it; pt sts she showered twice afterwards and hasnt seen any bugs on her currently

## 2020-04-23 NOTE — ED Provider Notes (Addendum)
[facility]    CSN: [id number] Arrival date & time: 04/23/20  1941      History   Chief Complaint Chief Complaint  Patient presents with  . Insect Bite    HPI Cindy Mcguire is a 34 y.o. female.   Who presented to the urgent care with a complaint of lice in swimming suit.  States she was here to have her private part check to make sure there is no lice. She reports she order swimming suit from [employer] and it was filled with lice.  Currently is not sure if she has any bite. Denies any aggravating or alleviating factors.  Has not used any medication.  Nothing made her symptoms worse.denies similar symptoms in the past.  Denies chills, fever, rash, itching  The history is provided by the patient. No language interpreter was used.    Past Medical History:  Diagnosis Date  . Bronchitis   . Medical history non-contributory   . Pneumonia     There are no problems to display for this patient.   Past Surgical History:  Procedure Laterality Date  . WISDOM TOOTH EXTRACTION      OB History   No obstetric history on file.      Home Medications    Prior to Admission medications   Medication Sig Start Date End Date Taking? Authorizing Provider  cephALEXin (KEFLEX) 500 MG capsule Take 1 capsule (500 mg total) by mouth 4 (four) times daily. Patient not taking: Reported on 04/23/2020 11/24/15   Ripley Fraise, MD  ivermectin (STROMECTOL) 3 MG TABS tablet Take 7.5 tablets (22,500 mcg total) by mouth once for 1 dose. Repeat 10 days after 04/23/20 04/23/20  Emerson Monte, FNP    Family History History reviewed. No pertinent family history.  Social History Social History   Tobacco Use  . Smoking status: Current Every Day Smoker    Packs/day: 0.75    Types: Cigarettes  Substance Use Topics  . Alcohol use: No  . Drug use: No     Allergies   Patient has no known allergies.   Review of Systems Review of Systems  Constitutional: Negative.     Respiratory: Negative.   Cardiovascular: Negative.   Skin: Negative.   All other systems reviewed and are negative.    Physical Exam Triage Vital Signs ED Triage Vitals  Enc Vitals Group     BP 04/23/20 0839 118/86     Pulse Rate 04/23/20 0839 88     Resp 04/23/20 0839 18     Temp 04/23/20 0839 98.3 F (36.8 C)     Temp Source 04/23/20 0839 Oral     SpO2 04/23/20 0839 97 %     Weight --      Height --      Head Circumference --      Peak Flow --      Pain Score 04/23/20 0842 0     Pain Loc --      Pain Edu? --      Excl. in [place]? --    No data found.  Updated Vital Signs BP 118/86 (BP Location: Right Arm)   Pulse 88   Temp 98.3 F (36.8 C) (Oral)   Resp 18   Wt 254 lb (115.2 kg)   SpO2 97%   BMI 38.62 kg/m   Visual Acuity Right Eye Distance:   Left Eye Distance:   Bilateral Distance:    Right Eye Near:   Left Eye Near:  Bilateral Near:     Physical Exam Vitals and nursing note reviewed.  Constitutional:      General: She is not in acute distress.    Appearance: Normal appearance. She is normal weight. She is not ill-appearing, toxic-appearing or diaphoretic.  Cardiovascular:     Rate and Rhythm: Normal rate and regular rhythm.     Pulses: Normal pulses.     Heart sounds: Normal heart sounds. No murmur. No friction rub. No gallop.   Pulmonary:     Effort: Pulmonary effort is normal. No respiratory distress.     Breath sounds: Normal breath sounds. No stridor. No wheezing, rhonchi or rales.  Chest:     Chest wall: No tenderness.  Skin:    General: Skin is warm and dry.     Findings: No rash.  Neurological:     Mental Status: She is alert.      UC Treatments / Results  Labs (all labs ordered are listed, but only abnormal results are displayed) Labs Reviewed - No data to display  EKG   Radiology No results found.  Procedures Procedures (including critical care time)  Medications Ordered in UC Medications - No data to  display  Initial Impression / Assessment and Plan / UC Course  I have reviewed the triage vital signs and the nursing notes.  Pertinent labs & imaging results that were available during my care of the patient were reviewed by me and considered in my medical decision making (see chart for details).    Patient is stable at discharge.  While there is no lice present at visit we will go ahead and treat her.  Ivermectin p.o. was prescribed.  Was advised to follow PCP for reevaluation.  Final Clinical Impressions(s) / UC Diagnoses   Final diagnoses:  Pediculosis capitis     Discharge Instructions     Take medication as prescribed Repeat treatment in 10 days Follow-up with PCP Please rate lice adult attachment Return or go to ED for worsening symptoms    ED Prescriptions    Medication Sig Dispense Auth. Provider   ivermectin (STROMECTOL) 3 MG TABS tablet Take 7.5 tablets (22,500 mcg total) by mouth once for 1 dose. Repeat 10 days after 15 tablet Caia Lofaro, Zachery Dakins, FNP     PDMP not reviewed this encounter.   Durward Parcel, FNP 04/23/20 0927    Durward Parcel, FNP 04/23/20 [phone]

## 2021-08-27 ENCOUNTER — Ambulatory Visit
Admission: EM | Admit: 2021-08-27 | Discharge: 2021-08-27 | Disposition: A | Discharge status: Home / Self Care | Payer: Self-pay | Attending: Family Medicine | Admitting: Family Medicine

## 2021-08-27 ENCOUNTER — Other Ambulatory Visit: Payer: Self-pay

## 2021-08-27 DIAGNOSIS — H9202 Otalgia, left ear: Secondary | ICD-10-CM

## 2021-08-27 DIAGNOSIS — J0100 Acute maxillary sinusitis, unspecified: Secondary | ICD-10-CM

## 2021-08-27 MED ORDER — AMOXICILLIN 875 MG PO TABS: 875.0000 mg | ORAL_TABLET | Freq: Two times a day (BID) | ORAL | 0 refills | Status: AC

## 2021-08-27 NOTE — ED Triage Notes (Signed)
Two week h/o cough, congestion, sore throat and 2 day h/o clogged sensation in left ear. Has been taking Dayquil without relief. Pt is not covid vaccinated. No n/v/d.

## 2021-08-27 NOTE — ED Provider Notes (Signed)
  [facility]   [id number] 08/27/21 Arrival Time: 0946  ASSESSMENT & PLAN:  1. Acute non-recurrent maxillary sinusitis   2. Acute otalgia, left    OTC symptom care as needed. Begin: Meds ordered this encounter  Medications   amoxicillin (AMOXIL) 875 MG tablet    Sig: Take 1 tablet (875 mg total) by mouth 2 (two) times daily for 10 days.    Dispense:  20 tablet    Refill:  0     Follow-up Information     [facility].   Specialty: Urgent Care Why: If worsening or failing to improve as anticipated. Contact information: [address] [place] [address] [phone]                Reviewed expectations re: course of current medical issues. Questions answered. Outlined signs and symptoms indicating need for more acute intervention. Understanding verbalized. After Visit Summary given.   SUBJECTIVE: History from: patient. Cindy Mcguire is a 35 y.o. female who reports nasal congestion and mild cough; x 2 weeks. Now with L sided "sinus pain"/pain in teeth. Denies: difficulty breathing. Normal PO intake without n/v/d.   OBJECTIVE:  Vitals:   08/27/21 1035  BP: 132/73  Pulse: 74  Resp: 18  Temp: 98.3 F (36.8 C)  TempSrc: Oral  SpO2: 96%    General appearance: alert; no distress Eyes: PERRLA; EOMI; conjunctiva normal HENT: [place]; AT; with nasal congestion: L sided max sinus TTP Neck: supple  Lungs: speaks full sentences without difficulty; unlabored Extremities: no edema Skin: warm and dry Neurologic: normal gait Psychological: alert and cooperative; normal mood and affect   No Known Allergies  Past Medical History:  Diagnosis Date   Bronchitis    Medical history non-contributory    Pneumonia    Social History   Socioeconomic History   Marital status: Married    Spouse name: Not on file   Number of children: Not on file   Years of education: Not on file   Highest education  level: Not on file  Occupational History   Not on file  Tobacco Use   Smoking status: Every Day    Packs/day: 0.75    Types: Cigarettes   Smokeless tobacco: Never  Substance and Sexual Activity   Alcohol use: No   Drug use: No   Sexual activity: Yes    Birth control/protection: None  Other Topics Concern   Not on file  Social History Narrative   Not on file   Social Determinants of Health   Financial Resource Strain: Not on file  Food Insecurity: Not on file  Transportation Needs: Not on file  Physical Activity: Not on file  Stress: Not on file  Social Connections: Not on file  Intimate Partner Violence: Not on file   No family history on file. Past Surgical History:  Procedure Laterality Date   WISDOM TOOTH EXTRACTION       Mardella Layman, MD 08/27/21 1113

## 2021-10-29 ENCOUNTER — Encounter: Payer: Self-pay | Admitting: Emergency Medicine

## 2021-10-29 ENCOUNTER — Ambulatory Visit
Admission: EM | Admit: 2021-10-29 | Discharge: 2021-10-29 | Disposition: A | Discharge status: Home / Self Care | Payer: Self-pay | Attending: Urgent Care | Admitting: Urgent Care

## 2021-10-29 ENCOUNTER — Other Ambulatory Visit: Payer: Self-pay

## 2021-10-29 DIAGNOSIS — K0889 Other specified disorders of teeth and supporting structures: Secondary | ICD-10-CM

## 2021-10-29 DIAGNOSIS — R519 Headache, unspecified: Secondary | ICD-10-CM

## 2021-10-29 DIAGNOSIS — J018 Other acute sinusitis: Secondary | ICD-10-CM

## 2021-10-29 MED ORDER — PSEUDOEPHEDRINE HCL 60 MG PO TABS: 60.0000 mg | ORAL_TABLET | Freq: Three times a day (TID) | ORAL | 0 refills | Status: DC | PRN

## 2021-10-29 MED ORDER — AMOXICILLIN-POT CLAVULANATE 875-125 MG PO TABS: 1.0000 | ORAL_TABLET | Freq: Two times a day (BID) | ORAL | 0 refills | Status: DC

## 2021-10-29 MED ORDER — CETIRIZINE HCL 10 MG PO TABS: 10.0000 mg | ORAL_TABLET | Freq: Every day | ORAL | 0 refills | Status: DC

## 2021-10-29 MED ORDER — NAPROXEN 500 MG PO TABS: 500.0000 mg | ORAL_TABLET | Freq: Two times a day (BID) | ORAL | 0 refills | Status: DC

## 2021-10-29 NOTE — ED Triage Notes (Signed)
Right side of mouth throbs.  States it hurts to bite down.  Went to dentist today and was told it was her sinuses that was causing the pain.

## 2021-10-29 NOTE — ED Provider Notes (Signed)
[facility]   MRN: [id number] DOB: 06/22/1986  Subjective:   Cindy Mcguire is a 35 y.o. female presenting for help with a sinus infection.  Has had severe and worsening facial pain, oral pain, worse over the left upper side of her mouth.  Went to the dentist and had x-rays done, was told that she did not have any problems with her teeth but the x-ray showed a sinus infection.  She was referred to an ENT but no one could see her.  Patient has been using naproxen without any relief.  Has had a runny and stuffy nose.  No cough, chest pain, shortness of breath.  No current facility-administered medications for this encounter. No current outpatient medications on file.   No Known Allergies  Past Medical History:  Diagnosis Date   Bronchitis    Medical history non-contributory    Pneumonia      Past Surgical History:  Procedure Laterality Date   WISDOM TOOTH EXTRACTION      History reviewed. No pertinent family history.  Social History   Tobacco Use   Smoking status: Every Day    Packs/day: 0.75    Types: Cigarettes   Smokeless tobacco: Never  Substance Use Topics   Alcohol use: No   Drug use: No    ROS   Objective:   Vitals: BP 131/88 (BP Location: Right Arm)   Pulse (!) 102   Temp 98.7 F (37.1 C) (Oral)   Resp 18   LMP 10/04/2021 (Exact Date)   SpO2 98%   Physical Exam Constitutional:      General: She is not in acute distress.    Appearance: She is well-developed. She is not ill-appearing.  HENT:     Head: Normocephalic and atraumatic.      Right Ear: Tympanic membrane and ear canal normal. No drainage or tenderness. No middle ear effusion. Tympanic membrane is not erythematous.     Left Ear: Tympanic membrane and ear canal normal. No drainage or tenderness.  No middle ear effusion. Tympanic membrane is not erythematous.     Nose: Congestion and rhinorrhea present.     Mouth/Throat:     Mouth: Mucous membranes are moist. No oral  lesions.     Dentition: No dental tenderness, gingival swelling, dental caries, dental abscesses or gum lesions.     Pharynx: Oropharynx is clear. No pharyngeal swelling, oropharyngeal exudate, posterior oropharyngeal erythema or uvula swelling.     Tonsils: No tonsillar exudate or tonsillar abscesses.  Eyes:     Extraocular Movements:     Right eye: Normal extraocular motion.     Left eye: Normal extraocular motion.     Conjunctiva/sclera: Conjunctivae normal.     Pupils: Pupils are equal, round, and reactive to light.  Cardiovascular:     Rate and Rhythm: Normal rate.  Pulmonary:     Effort: Pulmonary effort is normal.  Musculoskeletal:     Cervical back: Normal range of motion and neck supple.  Lymphadenopathy:     Cervical: No cervical adenopathy.  Skin:    General: Skin is warm and dry.  Neurological:     General: No focal deficit present.     Mental Status: She is alert and oriented to person, place, and time.  Psychiatric:        Mood and Affect: Mood normal.        Behavior: Behavior normal.    Assessment and Plan :   PDMP not reviewed this encounter.  1.  Acute non-recurrent sinusitis of other sinus   2. Pain, dental   3. Facial pain    Will start empiric treatment for sinusitis with Augmentin.  Recommended supportive care otherwise including the use of oral antihistamine, decongestant.  I did recommend that she seek a second consultation with a different dentist.  She will attempt this with one of the referrals I provide her with.  Counseled patient on potential for adverse effects with medications prescribed/recommended today, ER and return-to-clinic precautions discussed, patient verbalized understanding.     Wallis Bamberg, PA-C 10/29/21 0569

## 2021-10-29 NOTE — Discharge Instructions (Signed)
[facility] °Emergency dental service in [place], [place] °Address: [address], [place], [place] [address] °Phone: [phone] ° °[facility] °[phone] extension [phone] °[address] ° °[place] °[phone] °[address] ° °[employer] °[phone] °[address] ° °[facility] °[phone] extension [phone] °[address] [place], [place], [address] °First come first serve for the first 10 clients.  May do simple extractions only, no wisdom teeth or surgery.  You may try the second for Thursday of the month starting at 6:30 AM. ° °[employer] °You may call the school to see if they are still helping to provide dental care for emergent cases. ° °

## 2022-04-05 ENCOUNTER — Other Ambulatory Visit (HOSPITAL_COMMUNITY)
Admission: RE | Admit: 2022-04-05 | Discharge: 2022-04-05 | Disposition: A | Discharge status: Home / Self Care | Payer: BC Managed Care – PPO | Source: Ambulatory Visit | Attending: Adult Health | Admitting: Adult Health

## 2022-04-05 ENCOUNTER — Ambulatory Visit: Payer: BC Managed Care – PPO | Admitting: Adult Health

## 2022-04-05 ENCOUNTER — Encounter: Payer: Self-pay | Admitting: Adult Health

## 2022-04-05 VITALS — BP 127/83 | HR 98 | Ht 67.0 in | Wt 239.0 lb

## 2022-04-05 DIAGNOSIS — N946 Dysmenorrhea, unspecified: Secondary | ICD-10-CM | POA: Diagnosis not present

## 2022-04-05 DIAGNOSIS — Z01419 Encounter for gynecological examination (general) (routine) without abnormal findings: Secondary | ICD-10-CM | POA: Diagnosis present

## 2022-04-05 DIAGNOSIS — Z319 Encounter for procreative management, unspecified: Secondary | ICD-10-CM | POA: Diagnosis not present

## 2022-04-05 NOTE — Progress Notes (Signed)
Patient ID: Cindy Mcguire, female   DOB: February 01, 1986, 36 y.o.   MRN: [id number] ?History of Present Illness: ?Cindy Mcguire is a 36 year old white female, married, G0P0, in for a well woman gyn exam and pap. 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 This is her second marriage, and he was told had low sperm count years ago. ?Her A1c was 5.2 02/04/22. ?PCP is Cindy Gladden NP. ? ? ?Current Medications, Allergies, Past Medical History, Past Surgical History, Family History and Social History were reviewed in [employer] record.   ? ? ?Review of Systems: ? ?Patient denies any headaches, hearing loss, fatigue, blurred vision, shortness of breath, chest pain, abdominal pain, problems with bowel movements, urination, or intercourse. No joint pain or mood swings.  ?Periods regular but has cramps ? ? ?Physical Exam:BP 127/83 (BP Location: Right Arm, Patient Position: Sitting, Cuff Size: Normal)   Pulse 98   Ht 5\' 7"  (1.702 m)   Wt 239 lb (108.4 kg)   LMP 03/25/2022   BMI 37.43 kg/m?   ?General:  Well developed, well nourished, no acute distress ?Skin:  Warm and dry ?Neck:  Midline trachea, normal thyroid, good ROM, no lymphadenopathy ?Lungs; Clear to auscultation bilaterally ?Breast:  No dominant palpable mass, retraction, or nipple discharge ?Cardiovascular: Regular rate and rhythm ?Abdomen:  Soft, non tender, no hepatosplenomegaly ?Pelvic:  External genitalia is normal in appearance, no lesions.  The vagina is normal in appearance. Urethra has no lesions or masses. The cervix is nulliparous, pap with HR HPV genotyping performed.  Uterus is felt to be normal size, shape, and contour.  No adnexal masses or tenderness noted.Bladder is non tender, no masses felt. ?Extremities/musculoskeletal:  No swelling or varicosities noted, no clubbing or cyanosis ?Psych:  No mood changes, alert and cooperative,seems happy ?AA is 0 ?Fall risk is low ? ?  04/05/2022  ?  3:46 PM   ?Depression screen PHQ 2/9  ?Decreased Interest 0  ?Down, Depressed, Hopeless 0  ?PHQ - 2 Score 0  ?Altered sleeping 0  ?Tired, decreased energy 0  ?Change in appetite 0  ?Feeling bad or failure about yourself  0  ?Trouble concentrating 0  ?Moving slowly or fidgety/restless 0  ?Suicidal thoughts 0  ?PHQ-9 Score 0  ?  ? ?  04/05/2022  ?  3:46 PM  ?GAD 7 : Generalized Anxiety Score  ?Nervous, Anxious, on Edge 0  ?Control/stop worrying 0  ?Worry too much - different things 0  ?Trouble relaxing 0  ?Restless 0  ?Easily annoyed or irritable 0  ?Afraid - awful might happen 0  ?Total GAD 7 Score 0  ? ?  ? [place]stream - 04/05/22 1545   ? ?  ? Pregnancy Intention Screening  ? Does the patient want to become pregnant in the next year? Yes   ? Does the patient's partner want to become pregnant in the next year? Yes   ? Would the patient like to discuss contraceptive options today? No   ?  ? Contraception Wrap Up  ? Current Method Pregnant/Seeking Pregnancy;No Contraceptive Precautions   ? End Method Pregnant/Seeking Pregnancy;No Contraception Precautions   ? Contraception Counseling Provided No   ? ?  ?  ? ?  ?  ?Examination chaperoned by Celene Squibb LPN ? ?Impression and Plan: ?1. Encounter for gynecological examination with Papanicolaou smear of cervix ?Pap sent ?Pap in 3 years if normal ?Physical in 1 year  ?-  Cytology - PAP( [facility]) ? ?2. Menstrual cramps ?Will get pelvic US in office to assess uterus and ovaries, to rule PCO, too ?- US PELVIC COMPLETE WITH TRANSVAGINAL; Future ? ?3. Patient desires pregnancy ?Will check progesterone level 04/14/22 to assess for ovualtion ?Take OTC PNV ?Discussed timing of sex, have every other day, days 7-24 of cycle, and pee before sex and lay there after ?Get husband to increase vitamin C and drink glass of OJ daily ?Will get SA, later,did give her number to urology ?- Progesterone ?- US PELVIC COMPLETE WITH TRANSVAGINAL; Future  ? ? ? ?  ?  ?

## 2022-04-09 LAB — CYTOLOGY - PAP
Comment: NEGATIVE
Diagnosis: NEGATIVE
High risk HPV: NEGATIVE

## 2022-04-13 ENCOUNTER — Encounter (HOSPITAL_COMMUNITY): Payer: Self-pay

## 2022-04-13 ENCOUNTER — Emergency Department (HOSPITAL_COMMUNITY)
Admission: EM | Admit: 2022-04-13 | Discharge: 2022-04-13 | Discharge status: Against Medical Advice | Payer: BC Managed Care – PPO | Attending: Emergency Medicine | Admitting: Emergency Medicine

## 2022-04-13 ENCOUNTER — Other Ambulatory Visit: Payer: Self-pay

## 2022-04-13 DIAGNOSIS — Z5321 Procedure and treatment not carried out due to patient leaving prior to being seen by health care provider: Secondary | ICD-10-CM | POA: Diagnosis not present

## 2022-04-13 DIAGNOSIS — R1110 Vomiting, unspecified: Secondary | ICD-10-CM | POA: Diagnosis not present

## 2022-04-13 DIAGNOSIS — R1010 Upper abdominal pain, unspecified: Secondary | ICD-10-CM | POA: Insufficient documentation

## 2022-04-13 LAB — COMPREHENSIVE METABOLIC PANEL
ALT: 17 U/L (ref 0–44)
AST: 14 U/L — ABNORMAL LOW (ref 15–41)
Albumin: 4.4 g/dL (ref 3.5–5.0)
Alkaline Phosphatase: 97 U/L (ref 38–126)
Anion gap: 7 (ref 5–15)
BUN: 17 mg/dL (ref 6–20)
CO2: 24 mmol/L (ref 22–32)
Calcium: 8.9 mg/dL (ref 8.9–10.3)
Chloride: 107 mmol/L (ref 98–111)
Creatinine, Ser: 0.81 mg/dL (ref 0.44–1.00)
GFR, Estimated: >60 mL/min (ref >60)
Glucose, Bld: 106 mg/dL — ABNORMAL HIGH (ref 70–99)
Potassium: 4 mmol/L (ref 3.5–5.1)
Sodium: 138 mmol/L (ref 135–145)
Total Bilirubin: 0.5 mg/dL (ref 0.3–1.2)
Total Protein: 7.7 g/dL (ref 6.5–8.1)

## 2022-04-13 LAB — CBC
HCT: 44.7 % (ref 36.0–46.0)
Hemoglobin: 14.8 g/dL (ref 12.0–15.0)
MCH: 30.6 pg (ref 26.0–34.0)
MCHC: 33.1 g/dL (ref 30.0–36.0)
MCV: 92.4 fL (ref 80.0–100.0)
Platelets: 405 10*3/uL — ABNORMAL HIGH (ref 150–400)
RBC: 4.84 MIL/uL (ref 3.87–5.11)
RDW: 12 % (ref 11.5–15.5)
WBC: 18.7 10*3/uL — ABNORMAL HIGH (ref 4.0–10.5)
nRBC: 0 % (ref 0.0–0.2)

## 2022-04-13 LAB — LIPASE, BLOOD: Lipase: 34 U/L (ref 11–51)

## 2022-04-13 MED ORDER — ONDANSETRON 4 MG PO TBDP
4.0000 mg | ORAL_TABLET | Freq: Once | ORAL | Status: AC | PRN
  Administered 2022-04-13: 4 mg via ORAL
  Filled 2022-04-13: qty 1

## 2022-04-13 NOTE — ED Triage Notes (Signed)
Patient states upper abdominal pain for 1 hours with vomiting. Patient states pain came on all of a sudden.  ?

## 2022-04-22 LAB — PROGESTERONE: Progesterone: 3 ng/mL

## 2022-05-03 ENCOUNTER — Ambulatory Visit (INDEPENDENT_AMBULATORY_CARE_PROVIDER_SITE_OTHER): Payer: BC Managed Care – PPO

## 2022-05-03 DIAGNOSIS — N946 Dysmenorrhea, unspecified: Secondary | ICD-10-CM | POA: Diagnosis not present

## 2022-05-03 DIAGNOSIS — Z319 Encounter for procreative management, unspecified: Secondary | ICD-10-CM

## 2022-05-03 NOTE — Progress Notes (Signed)
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

## 2022-10-04 ENCOUNTER — Encounter (HOSPITAL_COMMUNITY): Payer: Self-pay

## 2023-07-27 ENCOUNTER — Ambulatory Visit
Admission: EM | Admit: 2023-07-27 | Discharge: 2023-07-27 | Disposition: A | Discharge status: Home / Self Care | Payer: BC Managed Care – PPO | Attending: Family Medicine | Admitting: Family Medicine

## 2023-07-27 ENCOUNTER — Other Ambulatory Visit: Payer: Self-pay

## 2023-07-27 ENCOUNTER — Encounter: Payer: Self-pay | Admitting: Emergency Medicine

## 2023-07-27 DIAGNOSIS — J209 Acute bronchitis, unspecified: Secondary | ICD-10-CM

## 2023-07-27 DIAGNOSIS — J0100 Acute maxillary sinusitis, unspecified: Secondary | ICD-10-CM

## 2023-07-27 MED ORDER — PROMETHAZINE-DM 6.25-15 MG/5ML PO SYRP: 5.0000 mL | ORAL_SOLUTION | Freq: Four times a day (QID) | ORAL | 0 refills | Status: AC | PRN

## 2023-07-27 MED ORDER — AMOXICILLIN-POT CLAVULANATE 875-125 MG PO TABS: 1.0000 | ORAL_TABLET | Freq: Two times a day (BID) | ORAL | 0 refills | Status: AC

## 2023-07-27 MED ORDER — ALBUTEROL SULFATE HFA 108 (90 BASE) MCG/ACT IN AERS: 2.0000 | INHALATION_SPRAY | RESPIRATORY_TRACT | 0 refills | Status: AC | PRN

## 2023-07-27 MED ORDER — PREDNISONE 20 MG PO TABS: 40.0000 mg | ORAL_TABLET | Freq: Every day | ORAL | 0 refills | Status: AC

## 2023-07-27 NOTE — ED Provider Notes (Signed)
[facility]    CSN: [id number] Arrival date & time: 07/27/23  0805      History   Chief Complaint Chief Complaint  Patient presents with   Cough    HPI Cindy Mcguire is a 37 y.o. female.   Patient presenting today with 2-week history of intermittent productive cough, chest tightness, nasal congestion, occasional wheezing.  Denies chest pain, fever, abdominal pain, nausea vomiting or diarrhea.  History of frequent bronchitis infections.  So far trying numerous over-the-counter cold and congestion medications with no relief.  Home COVID test was negative last night.    Past Medical History:  Diagnosis Date   Bronchitis    Medical history non-contributory    Pneumonia     Patient Active Problem List   Diagnosis Date Noted   Patient desires pregnancy 04/05/2022   Menstrual cramps 04/05/2022   Encounter for gynecological examination with Papanicolaou smear of cervix 04/05/2022    Past Surgical History:  Procedure Laterality Date   WISDOM TOOTH EXTRACTION      OB History   No obstetric history on file.      Home Medications    Prior to Admission medications   Medication Sig Start Date End Date Taking? Authorizing Provider  albuterol (VENTOLIN HFA) 108 (90 Base) MCG/ACT inhaler Inhale 2 puffs into the lungs every 4 (four) hours as needed for wheezing or shortness of breath. 07/27/23  Yes Particia Nearing, PA-C  amoxicillin-clavulanate (AUGMENTIN) 875-125 MG tablet Take 1 tablet by mouth every 12 (twelve) hours. 07/27/23  Yes Particia Nearing, PA-C  predniSONE (DELTASONE) 20 MG tablet Take 2 tablets (40 mg total) by mouth daily with breakfast. 07/27/23  Yes Particia Nearing, PA-C  predniSONE (DELTASONE) 20 MG tablet Take 2 tablets (40 mg total) by mouth daily with breakfast. 07/27/23  Yes Particia Nearing, PA-C  promethazine-dextromethorphan (PROMETHAZINE-DM) 6.25-15 MG/5ML syrup Take 5 mLs by mouth 4 (four) times daily as needed.  07/27/23  Yes Particia Nearing, PA-C  omeprazole (PRILOSEC) 40 MG capsule Take 40 mg by mouth daily. 02/04/22   [provider]    Family History Family History  Problem Relation Age of Onset   Hypertension Father    Diabetes Father    Thyroid cancer Father    Skin cancer Father    Diabetes Mother    Depression Mother    Fibromyalgia Mother    Rheum arthritis Mother     Social History Social History   Tobacco Use   Smoking status: Every Day    Current packs/day: 0.75    Types: Cigarettes   Smokeless tobacco: Never  Vaping Use   Vaping status: Never Used  Substance Use Topics   Alcohol use: No   Drug use: No     Allergies   Patient has no known allergies.   Review of Systems Review of Systems Per HPI  Physical Exam Triage Vital Signs ED Triage Vitals [07/27/23 0828]  Encounter Vitals Group     BP 122/82     Systolic BP Percentile      Diastolic BP Percentile      Pulse Rate 85     Resp 20     Temp 97.8 F (36.6 C)     Temp Source Oral     SpO2 96 %     Weight      Height      Head Circumference      Peak Flow      Pain Score  0     Pain Loc      Pain Education      Exclude from Growth Chart    No data found.  Updated Vital Signs BP 122/82 (BP Location: Right Arm)   Pulse 85   Temp 97.8 F (36.6 C) (Oral)   Resp 20   LMP 07/23/2023 (Approximate)   SpO2 96%   Visual Acuity Right Eye Distance:   Left Eye Distance:   Bilateral Distance:    Right Eye Near:   Left Eye Near:    Bilateral Near:     Physical Exam Vitals and nursing note reviewed.  Constitutional:      Appearance: Normal appearance. She is not ill-appearing.  HENT:     Head: Atraumatic.     Right Ear: Tympanic membrane normal.     Left Ear: Tympanic membrane normal.     Nose: Congestion present.     Mouth/Throat:     Mouth: Mucous membranes are moist.     Pharynx: Oropharynx is clear. Posterior oropharyngeal erythema present.  Eyes:     Extraocular  Movements: Extraocular movements intact.     Conjunctiva/sclera: Conjunctivae normal.  Cardiovascular:     Rate and Rhythm: Normal rate and regular rhythm.     Heart sounds: Normal heart sounds.  Pulmonary:     Effort: Pulmonary effort is normal.     Breath sounds: Wheezing present. No rales.  Musculoskeletal:        General: Normal range of motion.     Cervical back: Normal range of motion and neck supple.  Skin:    General: Skin is warm and dry.  Neurological:     Mental Status: She is alert and oriented to person, place, and time.     Motor: No weakness.     Gait: Gait normal.  Psychiatric:        Mood and Affect: Mood normal.        Thought Content: Thought content normal.        Judgment: Judgment normal.      UC Treatments / Results  Labs (all labs ordered are listed, but only abnormal results are displayed) Labs Reviewed - No data to display  EKG   Radiology No results found.  Procedures Procedures (including critical care time)  Medications Ordered in UC Medications - No data to display  Initial Impression / Assessment and Plan / UC Course  I have reviewed the triage vital signs and the nursing notes.  Pertinent labs & imaging results that were available during my care of the patient were reviewed by me and considered in my medical decision making (see chart for details).     Given duration and worsening course, will treat with prednisone, Augmentin, albuterol, Phenergan DM and supportive home care.  Return for worsening symptoms.  Final Clinical Impressions(s) / UC Diagnoses   Final diagnoses:  Acute non-recurrent maxillary sinusitis  Acute bronchitis, unspecified organism   Discharge Instructions   None    ED Prescriptions     Medication Sig Dispense Auth. Provider   predniSONE (DELTASONE) 20 MG tablet Take 2 tablets (40 mg total) by mouth daily with breakfast. 10 tablet Particia Nearing, PA-C   predniSONE (DELTASONE) 20 MG tablet Take  2 tablets (40 mg total) by mouth daily with breakfast. 10 tablet Particia Nearing, PA-C   albuterol (VENTOLIN HFA) 108 (90 Base) MCG/ACT inhaler Inhale 2 puffs into the lungs every 4 (four) hours as needed for wheezing or shortness of  breath. 18 g Roosvelt Maser [place], [place]   promethazine-dextromethorphan (PROMETHAZINE-DM) 6.25-15 MG/5ML syrup Take 5 mLs by mouth 4 (four) times daily as needed. 100 mL Particia Nearing, PA-C   amoxicillin-clavulanate (AUGMENTIN) 875-125 MG tablet Take 1 tablet by mouth every 12 (twelve) hours. 14 tablet Particia Nearing, [place]      PDMP not reviewed this encounter.   Particia Nearing, [place] 07/27/23 [phone]

## 2023-07-27 NOTE — ED Triage Notes (Addendum)
Pt reports intermittent productive cough x2 weeks. Pt reports "its hard to catch my breath with coughing",nasal congestion x2 days. Home covid test negative last night.

## 2023-10-19 ENCOUNTER — Ambulatory Visit
Admission: EM | Admit: 2023-10-19 | Discharge: 2023-10-19 | Disposition: A | Discharge status: Home / Self Care | Payer: BC Managed Care – PPO

## 2023-10-19 ENCOUNTER — Ambulatory Visit: Payer: BC Managed Care – PPO

## 2023-10-19 DIAGNOSIS — M79644 Pain in right finger(s): Secondary | ICD-10-CM

## 2023-10-19 NOTE — ED Triage Notes (Signed)
Pt reports right thumb injury after fall today, states pain goes up her wrist and into her index and middle fingers. denies any other injuries at the time of the fall.

## 2023-10-19 NOTE — Discharge Instructions (Signed)
Ice, elevate, ibuprofen and Tylenol as needed for pain.  We have placed you in a finger splint today for protection.  We will let you know tomorrow if anything is broken on your x-ray.

## 2023-10-20 NOTE — ED Provider Notes (Signed)
[facility]    CSN: [id number] Arrival date & time: 10/19/23  1913      History   Chief Complaint No chief complaint on file.   HPI Cindy Mcguire is a 37 y.o. female.   Presenting today with right thumb pain after falling today and jamming the finger.  Has bruising, swelling and significant decreased range of motion to the finger and sometimes pain shooting up toward the wrist.  Denies numbness, tingling, loss of range of motion in the hand, injuries elsewhere from the fall.  So far not trying anything over-the-counter for symptoms.    Past Medical History:  Diagnosis Date   Bronchitis    Medical history non-contributory    Pneumonia     Patient Active Problem List   Diagnosis Date Noted   Patient desires pregnancy 04/05/2022   Menstrual cramps 04/05/2022   Encounter for gynecological examination with Papanicolaou smear of cervix 04/05/2022    Past Surgical History:  Procedure Laterality Date   WISDOM TOOTH EXTRACTION      OB History   No obstetric history on file.      Home Medications    Prior to Admission medications   Medication Sig Start Date End Date Taking? Authorizing Provider  busPIRone (BUSPAR) 10 MG tablet Take 10 mg by mouth 2 (two) times daily. 10/05/23 10/04/24 Yes [provider]  sertraline (ZOLOFT) 50 MG tablet Take 50 mg by mouth daily. 10/05/23  Yes [provider]  albuterol (VENTOLIN HFA) 108 (90 Base) MCG/ACT inhaler Inhale 2 puffs into the lungs every 4 (four) hours as needed for wheezing or shortness of breath. 07/27/23   Particia Nearing, PA-C  amoxicillin-clavulanate (AUGMENTIN) 875-125 MG tablet Take 1 tablet by mouth every 12 (twelve) hours. 07/27/23   Particia Nearing, PA-C  omeprazole (PRILOSEC) 40 MG capsule Take 40 mg by mouth daily. 02/04/22   [provider]  predniSONE (DELTASONE) 20 MG tablet Take 2 tablets (40 mg total) by mouth daily with breakfast. 07/27/23   Particia Nearing, PA-C  predniSONE (DELTASONE) 20 MG tablet Take 2 tablets (40 mg total) by mouth daily with breakfast. 07/27/23   Particia Nearing, PA-C  promethazine-dextromethorphan (PROMETHAZINE-DM) 6.25-15 MG/5ML syrup Take 5 mLs by mouth 4 (four) times daily as needed. 07/27/23   Particia Nearing, PA-C    Family History Family History  Problem Relation Age of Onset   Hypertension Father    Diabetes Father    Thyroid cancer Father    Skin cancer Father    Diabetes Mother    Depression Mother    Fibromyalgia Mother    Rheum arthritis Mother     Social History Social History   Tobacco Use   Smoking status: Every Day    Current packs/day: 0.75    Types: Cigarettes   Smokeless tobacco: Never  Vaping Use   Vaping status: Never Used  Substance Use Topics   Alcohol use: No   Drug use: No     Allergies   Patient has no known allergies.   Review of Systems Review of Systems Per HPI  Physical Exam Triage Vital Signs ED Triage Vitals  Encounter Vitals Group     BP 10/19/23 1941 (!) 145/81     Systolic BP Percentile --      Diastolic BP Percentile --      Pulse Rate 10/19/23 1941 97     Resp 10/19/23 1941 17     Temp 10/19/23 1941 98.3  F (36.8 C)     Temp Source 10/19/23 1941 Oral     SpO2 10/19/23 1941 95 %     Weight --      Height --      Head Circumference --      Peak Flow --      Pain Score 10/19/23 1943 3     Pain Loc --      Pain Education --      Exclude from Growth Chart --    No data found.  Updated Vital Signs BP (!) 145/81 (BP Location: Right Arm)   Pulse 97   Temp 98.3 F (36.8 C) (Oral)   Resp 17   LMP 10/16/2023 (Exact Date)   SpO2 95%   Visual Acuity Right Eye Distance:   Left Eye Distance:   Bilateral Distance:    Right Eye Near:   Left Eye Near:    Bilateral Near:     Physical Exam Vitals and nursing note reviewed.  Constitutional:      Appearance: Normal appearance. She is not ill-appearing.  HENT:     Head:  Atraumatic.  Eyes:     Extraocular Movements: Extraocular movements intact.     Conjunctiva/sclera: Conjunctivae normal.  Cardiovascular:     Rate and Rhythm: Normal rate and regular rhythm.     Heart sounds: Normal heart sounds.  Pulmonary:     Effort: Pulmonary effort is normal.     Breath sounds: Normal breath sounds.  Musculoskeletal:        General: Swelling, tenderness and signs of injury present. No deformity. Normal range of motion.     Cervical back: Normal range of motion and neck supple.     Comments: Bruising, edema, tenderness to palpation to the right thumb.  Range of motion decreased in the thumb   Skin:    General: Skin is warm and dry.     Findings: Bruising present.  Neurological:     Mental Status: She is alert and oriented to person, place, and time.     Comments: Right hand neurovascularly intact  Psychiatric:        Mood and Affect: Mood normal.        Thought Content: Thought content normal.        Judgment: Judgment normal.      UC Treatments / Results  Labs (all labs ordered are listed, but only abnormal results are displayed) Labs Reviewed - No data to display  EKG   Radiology DG Hand Complete Right  Result Date: 10/19/2023 CLINICAL DATA:  Fall landing on hand.  Pain. EXAM: RIGHT HAND - COMPLETE 3+ VIEW COMPARISON:  None Available. FINDINGS: There is no evidence of fracture or dislocation. There is no evidence of arthropathy or other focal bone abnormality. Mild dorsal soft tissue edema. IMPRESSION: Mild dorsal soft tissue edema. No fracture or subluxation of the right hand. Electronically Signed   By: Narda Rutherford M.D.   On: 10/19/2023 22:00    Procedures Procedures (including critical care time)  Medications Ordered in UC Medications - No data to display  Initial Impression / Assessment and Plan / UC Course  I have reviewed the triage vital signs and the nursing notes.  Pertinent labs & imaging results that were available during my  care of the patient were reviewed by me and considered in my medical decision making (see chart for details).     X-ray of the right hand negative for acute bony normality.  Finger splint  placed for comfort, discussed rest for the call, over-the-counter pain relievers.  Return for worsening symptoms.  Final Clinical Impressions(s) / UC Diagnoses   Final diagnoses:  Thumb pain, right     Discharge Instructions      Ice, elevate, ibuprofen and Tylenol as needed for pain.  We have placed you in a finger splint today for protection.  We will let you know tomorrow if anything is broken on your x-ray.    ED Prescriptions   None    PDMP not reviewed this encounter.   Particia Nearing, [place] 10/20/23 Rickey Primus

## 2024-08-01 ENCOUNTER — Telehealth: Payer: Self-pay

## 2024-09-30 ENCOUNTER — Encounter: Payer: Self-pay | Admitting: Emergency Medicine

## 2024-09-30 ENCOUNTER — Other Ambulatory Visit: Payer: Self-pay

## 2024-09-30 ENCOUNTER — Ambulatory Visit
Admission: EM | Admit: 2024-09-30 | Discharge: 2024-09-30 | Disposition: A | Discharge status: Home / Self Care | Attending: Family Medicine | Admitting: Family Medicine

## 2024-09-30 DIAGNOSIS — R059 Cough, unspecified: Secondary | ICD-10-CM | POA: Diagnosis not present

## 2024-09-30 DIAGNOSIS — J069 Acute upper respiratory infection, unspecified: Secondary | ICD-10-CM | POA: Diagnosis not present

## 2024-09-30 DIAGNOSIS — J029 Acute pharyngitis, unspecified: Secondary | ICD-10-CM | POA: Diagnosis not present

## 2024-09-30 DIAGNOSIS — B9789 Other viral agents as the cause of diseases classified elsewhere: Secondary | ICD-10-CM | POA: Insufficient documentation

## 2024-09-30 LAB — POCT RAPID STREP A (OFFICE): Rapid Strep A Screen: NEGATIVE

## 2024-09-30 MED ORDER — NYSTATIN 100000 UNIT/ML MT SUSP: 500000.0000 [IU] | Freq: Four times a day (QID) | OROMUCOSAL | 0 refills | Status: AC

## 2024-09-30 MED ORDER — AZELASTINE HCL 0.1 % NA SOLN: 1.0000 | Freq: Two times a day (BID) | NASAL | 0 refills | Status: AC

## 2024-09-30 MED ORDER — PROMETHAZINE-DM 6.25-15 MG/5ML PO SYRP: 5.0000 mL | ORAL_SOLUTION | Freq: Four times a day (QID) | ORAL | 0 refills | Status: AC | PRN

## 2024-09-30 NOTE — Discharge Instructions (Signed)
 You may take over-the-counter cold and congestion medications in addition to the prescribed medications.  I have sent in some nystatin rinse in case the patchiness to your uvula is some thrush.  Will let you know if your throat culture comes back showing bacterial growth.  Your rapid strep test was negative.

## 2024-09-30 NOTE — ED Triage Notes (Signed)
 Pt reports sore throat, headache, fatigue, white patches to uvula since yesterday.denies any known fevers. Has taken dayquil at 3am this am.

## 2024-10-03 ENCOUNTER — Ambulatory Visit (HOSPITAL_COMMUNITY): Payer: Self-pay

## 2024-10-03 LAB — CULTURE, GROUP A STREP (THRC)

## 2024-10-03 NOTE — ED Provider Notes (Signed)
 [facility]    CSN: [id number] Arrival date & time: 09/30/24  1228      History   Chief Complaint Chief Complaint  Patient presents with   Sore Throat    HPI Cindy Mcguire is a 38 y.o. female.   Pt reports sore throat, headache, fatigue, white patches to uvula since yesterday.denies any known fevers. Has taken dayquil at 3am this am.       Past Medical History:  Diagnosis Date   Bronchitis    Medical history non-contributory    Pneumonia     Patient Active Problem List   Diagnosis Date Noted   Patient desires pregnancy 04/05/2022   Menstrual cramps 04/05/2022   Encounter for gynecological examination with Papanicolaou smear of cervix 04/05/2022    Past Surgical History:  Procedure Laterality Date   WISDOM TOOTH EXTRACTION      OB History   No obstetric history on file.      Home Medications    Prior to Admission medications   Medication Sig Start Date End Date Taking? Authorizing Provider  ALPRAZolam (XANAX) 1 MG tablet Take 1 mg by mouth. 06/06/24  Yes [provider]  azelastine (ASTELIN) 0.1 % nasal spray Place 1 spray into both nostrils 2 (two) times daily. Use in each nostril as directed 09/30/24  Yes Stuart Vernell Norris, PA-C  nystatin (MYCOSTATIN) 100000 UNIT/ML suspension Take 5 mLs (500,000 Units total) by mouth 4 (four) times daily. 09/30/24  Yes Stuart Vernell Norris, PA-C  promethazine -dextromethorphan (PROMETHAZINE -DM) 6.25-15 MG/5ML syrup Take 5 mLs by mouth 4 (four) times daily as needed. 09/30/24  Yes Stuart Vernell Norris, PA-C  albuterol  (VENTOLIN  HFA) 108 [phone] Base) MCG/ACT inhaler Inhale 2 puffs into the lungs every 4 (four) hours as needed for wheezing or shortness of breath. 07/27/23   Stuart Vernell Norris, PA-C  amoxicillin -clavulanate (AUGMENTIN ) 875-125 MG tablet Take 1 tablet by mouth every 12 (twelve) hours. 07/27/23   Stuart Vernell Norris, PA-C  buPROPion (WELLBUTRIN XL) 150 MG 24 hr tablet Take 150 mg  by mouth every morning.    [provider]  busPIRone (BUSPAR) 10 MG tablet Take 10 mg by mouth 2 (two) times daily. 10/05/23 10/04/24  [provider]  omeprazole (PRILOSEC) 40 MG capsule Take 40 mg by mouth daily. 02/04/22   [provider]  predniSONE  (DELTASONE ) 20 MG tablet Take 2 tablets (40 mg total) by mouth daily with breakfast. 07/27/23   Stuart Vernell Norris, PA-C  predniSONE  (DELTASONE ) 20 MG tablet Take 2 tablets (40 mg total) by mouth daily with breakfast. 07/27/23   Stuart Vernell Norris, PA-C  promethazine -dextromethorphan (PROMETHAZINE -DM) 6.25-15 MG/5ML syrup Take 5 mLs by mouth 4 (four) times daily as needed. 07/27/23   Stuart Vernell Norris, PA-C  sertraline (ZOLOFT) 50 MG tablet Take 50 mg by mouth daily. 10/05/23   [provider]    Family History Family History  Problem Relation Age of Onset   Hypertension Father    Diabetes Father    Thyroid cancer Father    Skin cancer Father    Diabetes Mother    Depression Mother    Fibromyalgia Mother    Rheum arthritis Mother     Social History Social History   Tobacco Use   Smoking status: Every Day    Current packs/day: 0.50    Types: Cigarettes   Smokeless tobacco: Never  Vaping Use   Vaping status: Never Used  Substance Use Topics   Alcohol use: No   Drug  use: No     Allergies   Patient has no known allergies.   Review of Systems Review of Systems PER HPI  Physical Exam Triage Vital Signs ED Triage Vitals  Encounter Vitals Group     BP 09/30/24 1243 125/84     Girls Systolic BP Percentile --      Girls Diastolic BP Percentile --      Boys Systolic BP Percentile --      Boys Diastolic BP Percentile --      Pulse Rate 09/30/24 1243 80     Resp 09/30/24 1243 20     Temp 09/30/24 1243 97.9 F (36.6 C)     Temp Source 09/30/24 1243 Oral     SpO2 09/30/24 1243 95 %     Weight --      Height --      Head Circumference --      Peak Flow --      Pain Score  09/30/24 1241 6     Pain Loc --      Pain Education --      Exclude from Growth Chart --    No data found.  Updated Vital Signs BP 125/84 (BP Location: Right Arm)   Pulse 80   Temp 97.9 F (36.6 C) (Oral)   Resp 20   LMP 09/05/2024 (Approximate)   SpO2 95%   Visual Acuity Right Eye Distance:   Left Eye Distance:   Bilateral Distance:    Right Eye Near:   Left Eye Near:    Bilateral Near:     Physical Exam Vitals and nursing note reviewed.  Constitutional:      Appearance: Normal appearance.  HENT:     Head: Atraumatic.     Right Ear: Tympanic membrane and external ear normal.     Left Ear: Tympanic membrane and external ear normal.     Nose: Rhinorrhea present.     Mouth/Throat:     Mouth: Mucous membranes are moist.     Pharynx: Posterior oropharyngeal erythema present.     Comments: Patchy irritation to uvula, mild oropharyngeal erythema without significant tonsillar edema, no exudates.  Uvula midline, oral airway patent Eyes:     Extraocular Movements: Extraocular movements intact.     Conjunctiva/sclera: Conjunctivae normal.  Cardiovascular:     Rate and Rhythm: Normal rate and regular rhythm.     Heart sounds: Normal heart sounds.  Pulmonary:     Effort: Pulmonary effort is normal.     Breath sounds: Normal breath sounds. No wheezing.  Musculoskeletal:        General: Normal range of motion.     Cervical back: Normal range of motion and neck supple.  Lymphadenopathy:     Cervical: No cervical adenopathy.  Skin:    General: Skin is warm and dry.  Neurological:     Mental Status: She is alert and oriented to person, place, and time.  Psychiatric:        Mood and Affect: Mood normal.        Thought Content: Thought content normal.      UC Treatments / Results  Labs (all labs ordered are listed, but only abnormal results are displayed) Labs Reviewed  CULTURE, GROUP A STREP [facility])  POCT RAPID STREP A (OFFICE)    EKG   Radiology No results  found.  Procedures Procedures (including critical care time)  Medications Ordered in UC Medications - No data to display  Initial Impression / Assessment  and Plan / UC Course  I have reviewed the triage vital signs and the nursing notes.  Pertinent labs & imaging results that were available during my care of the patient were reviewed by me and considered in my medical decision making (see chart for details).     Vitals and exam overall reassuring, rapid strep negative, throat culture pending.  Suspect viral respiratory illness.  Treat with Phenergan  DM, Astelin, nystatin rinse in case patchiness to uvula may be a bit of thrush and adjust treatment if throat culture positive.  Return for worsening or unresolving symptoms.  Final Clinical Impressions(s) / UC Diagnoses   Final diagnoses:  Sore throat  Viral URI with cough     Discharge Instructions      You may take over-the-counter cold and congestion medications in addition to the prescribed medications.  I have sent in some nystatin rinse in case the patchiness to your uvula is some thrush.  Will let you know if your throat culture comes back showing bacterial growth.  Your rapid strep test was negative.    ED Prescriptions     Medication Sig Dispense Auth. Provider   nystatin (MYCOSTATIN) 100000 UNIT/ML suspension Take 5 mLs (500,000 Units total) by mouth 4 (four) times daily. 60 mL Stuart Vernell Norris, PA-C   azelastine (ASTELIN) 0.1 % nasal spray Place 1 spray into both nostrils 2 (two) times daily. Use in each nostril as directed 30 mL Stuart Vernell Norris, PA-C   promethazine -dextromethorphan (PROMETHAZINE -DM) 6.25-15 MG/5ML syrup Take 5 mLs by mouth 4 (four) times daily as needed. 100 mL Stuart Vernell Norris, [place]      PDMP not reviewed this encounter.   Stuart Vernell [place], [place] 10/03/24 [phone]
